# Patient Record
Sex: Female | Born: 1980 | Race: Black or African American | Hispanic: No | Marital: Married | Smoking: Never smoker
Health system: Southern US, Community
[De-identification: ages and names within clinical notes are randomized; demographics above are authoritative.]

## PROBLEM LIST (undated history)

## (undated) DIAGNOSIS — T7840XA Allergy, unspecified, initial encounter: Secondary | ICD-10-CM

## (undated) DIAGNOSIS — F32A Depression, unspecified: Secondary | ICD-10-CM

## (undated) DIAGNOSIS — B009 Herpesviral infection, unspecified: Secondary | ICD-10-CM

## (undated) HISTORY — DX: Allergy, unspecified, initial encounter: T78.40XA

## (undated) HISTORY — DX: Herpesviral infection, unspecified: B00.9

## (undated) HISTORY — DX: Depression, unspecified: F32.A

---

## 2017-10-23 DIAGNOSIS — Z8632 Personal history of gestational diabetes: Secondary | ICD-10-CM | POA: Insufficient documentation

## 2019-12-31 DIAGNOSIS — J4520 Mild intermittent asthma, uncomplicated: Secondary | ICD-10-CM | POA: Insufficient documentation

## 2020-01-07 DIAGNOSIS — E119 Type 2 diabetes mellitus without complications: Secondary | ICD-10-CM | POA: Insufficient documentation

## 2020-02-10 HISTORY — PX: BARIATRIC SURGERY: SHX1103

## 2020-05-29 DIAGNOSIS — Z9884 Bariatric surgery status: Secondary | ICD-10-CM | POA: Insufficient documentation

## 2020-09-23 ENCOUNTER — Emergency Department (HOSPITAL_COMMUNITY)
Admission: EM | Admit: 2020-09-23 | Discharge: 2020-09-23 | Disposition: A | Discharge status: Home / Self Care | Payer: Medicaid Other | Attending: Emergency Medicine | Admitting: Emergency Medicine

## 2020-09-23 ENCOUNTER — Emergency Department (HOSPITAL_COMMUNITY): Payer: Medicaid Other

## 2020-09-23 ENCOUNTER — Encounter (HOSPITAL_COMMUNITY): Payer: Self-pay

## 2020-09-23 ENCOUNTER — Other Ambulatory Visit: Payer: Self-pay

## 2020-09-23 DIAGNOSIS — K8020 Calculus of gallbladder without cholecystitis without obstruction: Secondary | ICD-10-CM | POA: Insufficient documentation

## 2020-09-23 DIAGNOSIS — N201 Calculus of ureter: Secondary | ICD-10-CM | POA: Insufficient documentation

## 2020-09-23 DIAGNOSIS — E876 Hypokalemia: Secondary | ICD-10-CM | POA: Insufficient documentation

## 2020-09-23 DIAGNOSIS — K439 Ventral hernia without obstruction or gangrene: Secondary | ICD-10-CM | POA: Diagnosis not present

## 2020-09-23 DIAGNOSIS — R109 Unspecified abdominal pain: Secondary | ICD-10-CM | POA: Diagnosis present

## 2020-09-23 LAB — COMPREHENSIVE METABOLIC PANEL
ALT: 31 U/L (ref 0–44)
AST: 23 U/L (ref 15–41)
Albumin: 3.4 g/dL — ABNORMAL LOW (ref 3.5–5.0)
Alkaline Phosphatase: 85 U/L (ref 38–126)
Anion gap: 7 (ref 5–15)
BUN: 10 mg/dL (ref 6–20)
CO2: 25 mmol/L (ref 22–32)
Calcium: 8.9 mg/dL (ref 8.9–10.3)
Chloride: 106 mmol/L (ref 98–111)
Creatinine, Ser: 0.67 mg/dL (ref 0.44–1.00)
GFR, Estimated: >60 mL/min (ref >60)
Glucose, Bld: 123 mg/dL — ABNORMAL HIGH (ref 70–99)
Potassium: 3 mmol/L — ABNORMAL LOW (ref 3.5–5.1)
Sodium: 138 mmol/L (ref 135–145)
Total Bilirubin: 0.3 mg/dL (ref 0.3–1.2)
Total Protein: 6.6 g/dL (ref 6.5–8.1)

## 2020-09-23 LAB — I-STAT BETA HCG BLOOD, ED (MC, WL, AP ONLY): I-stat hCG, quantitative: <5 m[IU]/mL (ref <5)

## 2020-09-23 LAB — URINALYSIS, ROUTINE W REFLEX MICROSCOPIC

## 2020-09-23 LAB — CBC WITH DIFFERENTIAL/PLATELET
Abs Immature Granulocytes: 0.01 10*3/uL (ref 0.00–0.07)
Basophils Absolute: 0 10*3/uL (ref 0.0–0.1)
Basophils Relative: 0 %
Eosinophils Absolute: 0.1 10*3/uL (ref 0.0–0.5)
Eosinophils Relative: 1 %
HCT: 34.5 % — ABNORMAL LOW (ref 36.0–46.0)
Hemoglobin: 11.5 g/dL — ABNORMAL LOW (ref 12.0–15.0)
Immature Granulocytes: 0 %
Lymphocytes Relative: 29 %
Lymphs Abs: 1.4 10*3/uL (ref 0.7–4.0)
MCH: 29.9 pg (ref 26.0–34.0)
MCHC: 33.3 g/dL (ref 30.0–36.0)
MCV: 89.6 fL (ref 80.0–100.0)
Monocytes Absolute: 0.3 10*3/uL (ref 0.1–1.0)
Monocytes Relative: 6 %
Neutro Abs: 2.9 10*3/uL (ref 1.7–7.7)
Neutrophils Relative %: 64 %
Platelets: 269 10*3/uL (ref 150–400)
RBC: 3.85 MIL/uL — ABNORMAL LOW (ref 3.87–5.11)
RDW: 13.1 % (ref 11.5–15.5)
WBC: 4.6 10*3/uL (ref 4.0–10.5)
nRBC: 0 % (ref 0.0–0.2)

## 2020-09-23 LAB — LIPASE, BLOOD: Lipase: 22 U/L (ref 11–51)

## 2020-09-23 LAB — URINALYSIS, MICROSCOPIC (REFLEX)
RBC / HPF: >50 RBC/hpf (ref 0–5)
WBC, UA: >50 WBC/hpf (ref 0–5)

## 2020-09-23 MED ORDER — ONDANSETRON 4 MG PO TBDP: 4.0000 mg | ORAL_TABLET | Freq: Three times a day (TID) | ORAL | 0 refills | Status: DC | PRN

## 2020-09-23 MED ORDER — TAMSULOSIN HCL 0.4 MG PO CAPS
0.4000 mg | ORAL_CAPSULE | Freq: Once | ORAL | Status: AC
  Administered 2020-09-23: 0.4 mg via ORAL
  Filled 2020-09-23: qty 1

## 2020-09-23 MED ORDER — TAMSULOSIN HCL 0.4 MG PO CAPS: 0.4000 mg | ORAL_CAPSULE | Freq: Every day | ORAL | 0 refills | Status: AC

## 2020-09-23 MED ORDER — OXYCODONE HCL 5 MG PO TABS: 5.0000 mg | ORAL_TABLET | Freq: Four times a day (QID) | ORAL | 0 refills | Status: AC | PRN

## 2020-09-23 MED ORDER — POTASSIUM CHLORIDE ER 20 MEQ PO TBCR: 20.0000 meq | EXTENDED_RELEASE_TABLET | Freq: Every day | ORAL | 0 refills | Status: DC

## 2020-09-23 MED ORDER — OXYCODONE-ACETAMINOPHEN 5-325 MG PO TABS
1.0000 | ORAL_TABLET | Freq: Once | ORAL | Status: AC
Start: 2020-09-23 — End: 2020-09-23
  Administered 2020-09-23: 1 via ORAL
  Filled 2020-09-23: qty 1

## 2020-09-23 MED ORDER — ONDANSETRON HCL 4 MG/2ML IJ SOLN
4.0000 mg | Freq: Once | INTRAMUSCULAR | Status: AC
  Administered 2020-09-23: 4 mg via INTRAVENOUS
  Filled 2020-09-23: qty 2

## 2020-09-23 MED ORDER — KETOROLAC TROMETHAMINE 30 MG/ML IJ SOLN
30.0000 mg | Freq: Once | INTRAMUSCULAR | Status: DC
  Filled 2020-09-23: qty 1

## 2020-09-23 MED ORDER — SODIUM CHLORIDE 0.9 % IV SOLN: Freq: Once | INTRAVENOUS | Status: AC

## 2020-09-23 MED ORDER — MORPHINE SULFATE (PF) 4 MG/ML IV SOLN
6.0000 mg | Freq: Once | INTRAVENOUS | Status: AC
  Administered 2020-09-23: 6 mg via INTRAVENOUS
  Filled 2020-09-23: qty 2

## 2020-09-23 NOTE — ED Provider Notes (Signed)
[facility] EMERGENCY DEPARTMENT Provider Note   CSN: [id number] Arrival date & time: 09/23/20  0749     History Chief Complaint  Patient presents with  . Flank Pain    Angie Wilcox is a 40 y.o. female with history of recent weight loss surgery in September 2021 at [facility] in [facility]) presents to the ED for sudden onset right-sided mid abdominal pain that radiates to her flank that woke her up this morning at around 5 AM.  Pain is constant, moderate to severe.  No alleviating or aggravating factors.  Associated with nausea.  Patient looks uncomfortable.  No interventions.  Denies fevers, chills, chest pain, shortness of breath.  No vomiting.  Reports ever since her weight loss surgery she has 2-3 bowel movements every morning which is normal for her.  She had 2-3 bowel movements this morning like she typically does without any blood, melena.  No dysuria, hematuria, urinary frequency.  No known kidney stones.  She still has her gallbladder.  No known gallstones.  She is now ending her menses and is having vaginal bleeding.  HPI     History reviewed. No pertinent past medical history.  There are no problems to display for this patient.   History reviewed. No pertinent surgical history.   OB History   No obstetric history on file.     History reviewed. No pertinent family history.  Social History   Tobacco Use  . Smoking status: Never Smoker  . Smokeless tobacco: Never Used    Home Medications Prior to Admission medications   Medication Sig Start Date End Date Taking? Authorizing Provider  ondansetron (ZOFRAN ODT) 4 MG disintegrating tablet Take 1 tablet (4 mg total) by mouth every 8 (eight) hours as needed for nausea or vomiting. 09/23/20  Yes Liberty Handy, PA-C  oxyCODONE (OXY IR/ROXICODONE) 5 MG immediate release tablet Take 1 tablet (5 mg total) by mouth every 6 (six) hours as needed for up to 3 days for severe pain  or breakthrough pain. 09/23/20 09/26/20 Yes Sharen Heck J, PA-C  potassium chloride 20 MEQ TBCR Take 20 mEq by mouth daily for 10 days. 09/23/20 10/03/20 Yes Liberty Handy, PA-C  tamsulosin (FLOMAX) 0.4 MG CAPS capsule Take 1 capsule (0.4 mg total) by mouth daily for 15 days. 09/23/20 10/08/20 Yes Liberty Handy, PA-C    Allergies    Patient has no allergy information on record.  Review of Systems   Review of Systems  Gastrointestinal: Positive for abdominal pain and nausea.  Genitourinary: Positive for flank pain.  All other systems reviewed and are negative.   Physical Exam Updated Vital Signs BP 121/89   Pulse 74   Temp 98.6 F (37 C) (Oral)   Resp 18   LMP 09/15/2020   SpO2 95%   Physical Exam Vitals and nursing note reviewed.  Constitutional:      Appearance: She is well-developed.     Comments: Non toxic in NAD  HENT:     Head: Normocephalic and atraumatic.     Nose: Nose normal.  Eyes:     Conjunctiva/sclera: Conjunctivae normal.  Cardiovascular:     Rate and Rhythm: Normal rate and regular rhythm.     Pulses:          Radial pulses are 1+ on the right side and 1+ on the left side.       Dorsalis pedis pulses are 1+ on the right side and 1+ on the  left side.  Pulmonary:     Effort: Pulmonary effort is normal.     Breath sounds: Normal breath sounds.  Abdominal:     General: Bowel sounds are normal.     Palpations: Abdomen is soft.     Tenderness: There is abdominal tenderness (right mid, RUQ, right lateral and CVA tenderness). There is right CVA tenderness.     Comments: No G/R/R. No suprapubic tenderness. Negative Murphy's and McBurney's. Active BS to lower quadrants.   Musculoskeletal:        General: Normal range of motion.     Cervical back: Normal range of motion.  Skin:    General: Skin is warm and dry.     Capillary Refill: Capillary refill takes less than 2 seconds.     Comments: Skin is normal over abdomen/flank  Neurological:     Mental  Status: She is alert.     Comments: Sensation and strength intact upper/lower extremities  Psychiatric:        Behavior: Behavior normal.     ED Results / Procedures / Treatments   Labs (all labs ordered are listed, but only abnormal results are displayed) Labs Reviewed  CBC WITH DIFFERENTIAL/PLATELET - Abnormal; Notable for the following components:      Result Value   RBC 3.85 (*)    Hemoglobin 11.5 (*)    HCT 34.5 (*)    All other components within normal limits  COMPREHENSIVE METABOLIC PANEL - Abnormal; Notable for the following components:   Potassium 3.0 (*)    Glucose, Bld 123 (*)    Albumin 3.4 (*)    All other components within normal limits  URINALYSIS, ROUTINE W REFLEX MICROSCOPIC - Abnormal; Notable for the following components:   Color, Urine RED (*)    APPearance TURBID (*)    Glucose, UA   (*)    Value: TEST NOT REPORTED DUE TO COLOR INTERFERENCE OF URINE PIGMENT   Hgb urine dipstick   (*)    Value: TEST NOT REPORTED DUE TO COLOR INTERFERENCE OF URINE PIGMENT   Bilirubin Urine   (*)    Value: TEST NOT REPORTED DUE TO COLOR INTERFERENCE OF URINE PIGMENT   Ketones, ur   (*)    Value: TEST NOT REPORTED DUE TO COLOR INTERFERENCE OF URINE PIGMENT   Protein, ur   (*)    Value: TEST NOT REPORTED DUE TO COLOR INTERFERENCE OF URINE PIGMENT   Nitrite   (*)    Value: TEST NOT REPORTED DUE TO COLOR INTERFERENCE OF URINE PIGMENT   Leukocytes,Ua   (*)    Value: TEST NOT REPORTED DUE TO COLOR INTERFERENCE OF URINE PIGMENT   All other components within normal limits  URINALYSIS, MICROSCOPIC (REFLEX) - Abnormal; Notable for the following components:   Bacteria, UA MANY (*)    Non Squamous Epithelial PRESENT (*)    All other components within normal limits  URINE CULTURE  LIPASE, BLOOD  I-STAT BETA HCG BLOOD, ED ([facility], [facility], [facility] ONLY)    EKG None  Radiology CT Renal Stone Study  Result Date: 09/23/2020 CLINICAL DATA:  Right mid abdominal pain. Sudden onset right flank  pain EXAM: CT ABDOMEN AND PELVIS WITHOUT CONTRAST TECHNIQUE: Multidetector CT imaging of the abdomen and pelvis was performed following the standard protocol without IV contrast. COMPARISON:  None. FINDINGS: Lower chest:  No contributory findings. Hepatobiliary: No focal liver abnormality.Cholelithiasis. There is likely also sludge. No evidence of acute cholecystitis. Pancreas: Unremarkable. Spleen: Unremarkable. Adrenals/Urinary Tract: Negative adrenals.  Mild right hydroureteronephrosis and low-density renal expansion due to a 3 mm stone at the UVJ. No additional urolithiasis. Unremarkable bladder. Stomach/Bowel: Prior weight loss surgery. No bowel obstruction or visible inflammation. Vascular/Lymphatic: No acute vascular abnormality. No mass or adenopathy. Reproductive:No pathologic findings. Other: No ascites or pneumoperitoneum. Fatty midline hernia which is small. Musculoskeletal: No acute abnormalities. IMPRESSION: Obstructing 3 mm stone at the right UVJ. Cholelithiasis. Electronically Signed   By: Marnee Spring M.D.   On: 09/23/2020 10:27    Procedures Procedures   Medications Ordered in ED Medications  ondansetron (ZOFRAN) injection 4 mg (4 mg Intravenous Given 09/23/20 0813)  morphine 4 MG/ML injection 6 mg (6 mg Intravenous Given 09/23/20 0813)  0.9 %  sodium chloride infusion ( Intravenous Stopped 09/23/20 1022)  oxyCODONE-acetaminophen (PERCOCET/ROXICET) 5-325 MG per tablet 1 tablet (1 tablet Oral Given 09/23/20 1040)  tamsulosin (FLOMAX) capsule 0.4 mg (0.4 mg Oral Given 09/23/20 1039)    ED Course  I have reviewed the triage vital signs and the nursing notes.  Pertinent labs & imaging results that were available during my care of the patient were reviewed by me and considered in my medical decision making (see chart for details).    MDM Rules/Calculators/A&P                          EMR triage and nursing notes reviewed  40 yo F here for sudden onset right abdominal and flank  pain, nausea. History of SADI procedure 2021.   Labs, imaging ordered and personally reviewed and interpreted by me  Labs reveal - Normal WBC. Hgb 11.5. K3.0. UA grossly bloody.   Imaging reveals - obstructing 3 mm stone at UVJ with mild hydronephrosis.  Other incidental findings including gallstones with sludge, small ventral hernia.    Patient re-evaluated after medicines and feels significantly better  Will discharge with flomax, zofran, oxycodone, K.  Encouraged oral hydration.  Patient told about incidental CT findings.  Follow up with urology as needed. Return precautions given.   Final Clinical Impression(s) / ED Diagnoses Final diagnoses:  Ureterolithiasis  Gallstones  Ventral hernia without obstruction or gangrene  Hypokalemia    Rx / DC Orders ED Discharge Orders         Ordered    tamsulosin (FLOMAX) 0.4 MG CAPS capsule  Daily        09/23/20 1057    ondansetron (ZOFRAN ODT) 4 MG disintegrating tablet  Every 8 hours PRN        09/23/20 1057    oxyCODONE (OXY IR/ROXICODONE) 5 MG immediate release tablet  Every 6 hours PRN        09/23/20 1057    potassium chloride 20 MEQ TBCR  Daily        09/23/20 1105           Liberty Handy, [place] 09/23/20 1107    Melene Plan, DO 09/23/20 1110

## 2020-09-23 NOTE — ED Triage Notes (Signed)
Pt reports she is here today due to sudden onset of right sided flank pain. Pt denies any abd pain, N&V,fevers

## 2020-09-23 NOTE — Discharge Instructions (Addendum)
You have a 3 mm stone at the end of your right ureteral almost at the bladder  You should fully pass it into the bladder in the next couple of days.  Stay hydrated drink at least 64 oz of water a day to help produce more urine and push stone out.    Take ondansetron for nausea as needed. Take tamsulosin once daily for at least 1 week to help dilate the ureter and help expel the stone  Take [phone] mg acetaminophen every 6 hours for mild/moderate pain.  Take oxycodone 5 mg for severe or break through pain.   For break through and/or severe pain despite acetaminophen, take 5 mg oxycodone every 4 hours.  Oxycodone is a narcotic pain medication that has risk of overdose, death, dependence and abuse. Mild and expected side effects include nausea, stomach upset, drowsiness, constipation. Do not consume alcohol, drive or use heavy machinery while taking this medication. Do not leave unattended around children. Flush any remaining pills that you do not use and do not share.  The emergency department has a strict policy regarding prescription of narcotic medications. We prescribe a short course for acute, new pain or injuries. We are unable to refill this medication in the emergency department for chronic pain or repeatedly.  Refills need to be done by specialist or primary care provider or pain clinic.  Contact your primary care provider or specialist for chronic pain management and refill on narcotic medications.   Return to the ER for worsening pain despite medicines, fever, persistent vomiting, inability to urinate   You may choose to follow up with urology as needed   Other incidental findings on scan include gallstones and sludge as well as a very small ventral hernia

## 2020-09-24 LAB — URINE CULTURE

## 2021-02-09 ENCOUNTER — Telehealth: Payer: Self-pay | Admitting: Family Medicine

## 2021-02-09 NOTE — Telephone Encounter (Signed)
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 Her son was seen here on 02/08/21. They all have Medicaid health insurance. She wants her whole family including her husband to be pt's of ours.

## 2021-02-24 NOTE — Telephone Encounter (Signed)
Dr. Veto Kemps,  Harriett Sine asked me to check with you. You do have more than 10 medicaid pts on your panel. This is the mother of a new child you saw recently. She also has Medicaid. Will you accept her?

## 2021-03-05 NOTE — Telephone Encounter (Signed)
Approved scheduling pt since child is established with Dr. Veto Kemps.

## 2021-03-05 NOTE — Telephone Encounter (Signed)
I have called pt and left a vm to given [place] a call back to schedule a NP appointment.

## 2021-03-22 ENCOUNTER — Other Ambulatory Visit: Payer: Self-pay | Admitting: Obstetrics & Gynecology

## 2021-03-22 DIAGNOSIS — N6453 Retraction of nipple: Secondary | ICD-10-CM

## 2021-03-22 DIAGNOSIS — N6452 Nipple discharge: Secondary | ICD-10-CM

## 2021-03-22 DIAGNOSIS — N644 Mastodynia: Secondary | ICD-10-CM

## 2021-03-23 LAB — RESULTS CONSOLE HPV: CHL HPV: NEGATIVE

## 2021-03-23 LAB — HM PAP SMEAR

## 2021-04-28 ENCOUNTER — Other Ambulatory Visit: Payer: Self-pay

## 2021-04-28 ENCOUNTER — Other Ambulatory Visit: Payer: Self-pay | Admitting: Physical Medicine and Rehabilitation

## 2021-04-28 ENCOUNTER — Ambulatory Visit
Admission: RE | Admit: 2021-04-28 | Discharge: 2021-04-28 | Disposition: A | Discharge status: Home / Self Care | Payer: Self-pay | Source: Ambulatory Visit | Attending: Obstetrics & Gynecology | Admitting: Obstetrics & Gynecology

## 2021-04-28 ENCOUNTER — Ambulatory Visit
Admission: RE | Admit: 2021-04-28 | Discharge: 2021-04-28 | Disposition: A | Discharge status: Home / Self Care | Payer: PRIVATE HEALTH INSURANCE | Source: Ambulatory Visit | Attending: Obstetrics & Gynecology | Admitting: Obstetrics & Gynecology

## 2021-04-28 DIAGNOSIS — N6452 Nipple discharge: Secondary | ICD-10-CM

## 2021-04-28 DIAGNOSIS — N6453 Retraction of nipple: Secondary | ICD-10-CM

## 2021-04-28 DIAGNOSIS — N644 Mastodynia: Secondary | ICD-10-CM

## 2021-04-28 LAB — HM MAMMOGRAPHY

## 2021-05-13 ENCOUNTER — Other Ambulatory Visit: Payer: Self-pay | Admitting: Obstetrics & Gynecology

## 2021-05-13 DIAGNOSIS — N6459 Other signs and symptoms in breast: Secondary | ICD-10-CM

## 2021-06-04 ENCOUNTER — Other Ambulatory Visit: Payer: Self-pay

## 2021-06-05 ENCOUNTER — Other Ambulatory Visit: Payer: Medicaid Other

## 2021-06-07 ENCOUNTER — Ambulatory Visit: Payer: Medicaid Other | Admitting: Family Medicine

## 2021-06-07 ENCOUNTER — Encounter: Payer: Self-pay | Admitting: Family Medicine

## 2021-06-07 ENCOUNTER — Ambulatory Visit (INDEPENDENT_AMBULATORY_CARE_PROVIDER_SITE_OTHER): Payer: Medicaid Other | Admitting: Family Medicine

## 2021-06-07 ENCOUNTER — Other Ambulatory Visit: Payer: Self-pay

## 2021-06-07 VITALS — BP 110/66 | HR 100 | Temp 98.1°F | Ht 67.5 in | Wt 192.6 lb

## 2021-06-07 DIAGNOSIS — Z1159 Encounter for screening for other viral diseases: Secondary | ICD-10-CM

## 2021-06-07 DIAGNOSIS — Z114 Encounter for screening for human immunodeficiency virus [HIV]: Secondary | ICD-10-CM

## 2021-06-07 DIAGNOSIS — R928 Other abnormal and inconclusive findings on diagnostic imaging of breast: Secondary | ICD-10-CM | POA: Diagnosis not present

## 2021-06-07 DIAGNOSIS — Z9884 Bariatric surgery status: Secondary | ICD-10-CM

## 2021-06-07 DIAGNOSIS — E119 Type 2 diabetes mellitus without complications: Secondary | ICD-10-CM | POA: Diagnosis not present

## 2021-06-07 DIAGNOSIS — J4520 Mild intermittent asthma, uncomplicated: Secondary | ICD-10-CM | POA: Diagnosis not present

## 2021-06-07 DIAGNOSIS — Z87442 Personal history of urinary calculi: Secondary | ICD-10-CM | POA: Insufficient documentation

## 2021-06-07 DIAGNOSIS — B009 Herpesviral infection, unspecified: Secondary | ICD-10-CM | POA: Insufficient documentation

## 2021-06-07 NOTE — Progress Notes (Signed)
[facility]ARE [facility] [address] [place] [address] Dept: [phone] Dept Fax: [phone]  New Patient Office Visit  Subjective:    Patient ID: Angie Wilcox, female    DOB: 06-07-1980, 41 y.o..   MRN: [id number]  Chief Complaint  Patient presents with   Establish Care    NP- establish care.   No concerns.  Wants a referral to a new bariatric doctor.     History of Present Illness:  Patient is in today to establish care. Angie Wilcox was born in [place], [place]. 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 She moved to [place] with her husband and her youngest son [name]- 4) about 8 months ago. This is her 2nd marriage and they have been married about 5 years. Her older child [name]- 12) still lives in [place]. Angie Wilcox denies tobacco, alcohol, or drug use.  Angie Wilcox has a history of morbid obesity. At her heaviest, she weight 315 lbs (BMI= 48.6). She underwent a duodenal switch procedure (SADI-S) in Sept. of 2021. She would like to establish care with a surgeon locally for ongoing monitoring.  Angie Wilcox did have Type 2 diabetes prior to her surgery. Her last A1c was near 5 and she has not needed treatment for this.  Angie Wilcox has a history of mild, intermittent asthma. She is managed with an albuterol inhaler, but needs this only very occasionally.  Angie Wilcox has a history of an abnormal mammogram of the right breast. She is scheduled for an MRI of her right breast to evaluate.  Past Medical History: Patient Active Problem List   Diagnosis Date Noted   S/P biliopancreatic diversion with duodenal switch 05/29/2020   Type 2 diabetes mellitus without complication, without long-term current use of insulin (HCC) 01/07/2020   Morbid obesity (HCC) 01/07/2020   Mild intermittent asthma 12/31/2019   History of gestational diabetes 10/23/2017   Past Surgical History:   Procedure Laterality Date   BARIATRIC SURGERY  02/10/2020   Sadi -S   Family History  Problem Relation Age of Onset   Hypertension Mother    Cancer Paternal Aunt        thyroid   Cancer Maternal Grandmother    Diabetes Paternal Grandmother    Outpatient Medications Prior to Visit  Medication Sig Dispense Refill   albuterol (VENTOLIN HFA) 108 (90 Base) MCG/ACT inhaler Inhale into the lungs.     Cholecalciferol (VITAMIN D3) 10 MCG (400 UNIT) tablet Take by mouth.     Multiple Vitamin (MULTIVITAMIN) tablet Take 1 tablet by mouth daily. Bariatric vitamin     valACYclovir (VALTREX) 500 MG tablet Take 500 mg by mouth 2 (two) times daily as needed.     ondansetron (ZOFRAN ODT) 4 MG disintegrating tablet Take 1 tablet (4 mg total) by mouth every 8 (eight) hours as needed for nausea or vomiting. 20 tablet 0   potassium chloride 20 MEQ TBCR Take 20 mEq by mouth daily for 10 days. 10 tablet 0   No facility-administered medications prior to visit.   No Known Allergies    Objective:   Today's Vitals   06/07/21 0820  BP: 110/66  Pulse: 100  Temp: 98.1 F (36.7 C)  TempSrc: Temporal  SpO2: 98%  Weight: 192 lb 9.6 oz (87.4 kg)  Height: 5' 7.5" (1.715 m)   Body mass index is 29.72 kg/m.   General: Well developed, well nourished. No acute  distress. Psych: Alert and oriented. Normal mood and affect.  Health Maintenance Due  Topic Date Due   HEMOGLOBIN A1C  Never done   FOOT EXAM  Never done   OPHTHALMOLOGY EXAM  Never done   URINE MICROALBUMIN  Never done   Hepatitis C Screening  Never done   PAP SMEAR-Modifier  Never done   COVID-19 Vaccine (3 - Booster for Pfizer series) 02/27/2020   Imaging: Mammogram and right breast ultrasound (04/28/2021) IMPRESSION: 1. Indeterminate oval circumscribed hypoechoic mass in the RIGHT breast at the 7 o'clock axis, retroareolar, 1 cm from the nipple, measuring 6 mm. This may represent a complicated cyst or papilloma and may be unrelated to  the nipple changes/discharge. Ultrasound-guided biopsy is recommended after breast MRI (see below). 2. Nipple inversion/retraction following 1 or 2 episodes of bloody nipple discharge (but no discharge recently). This is a suspicious finding for which breast MRI with contrast is recommended for further characterization. 3. No evidence of malignancy within the LEFT breast.   RECOMMENDATION: 1. Breast MRI with contrast for further characterization of patient's suspicious nipple changes detailed above.   Recommendation for breast MRI based on data provided by the [employer] of Radiology Appropriateness Criteria, produced by an expert panel on breast imaging, showing breast MRI with contrast to have a high sensitivity and NPV for identifying the cause of pathologic nipple discharge or pathologic nipple retraction.   2. If the breast MRI is negative, ultrasound-guided biopsy would then be recommended to ensure benignity for the indeterminate mass seen by ultrasound in the RIGHT breast at the 7 o'clock axis, retroareolar, 1 cm from the nipple, measuring 6 mm.    Assessment & Plan:   1. Mild intermittent asthma without complication Stable on periodic albuterol  2. Type 2 diabetes mellitus without complication, without long-term current use of insulin (HCC) Appears this may have resolved with her metabolic surgery. I will check annual DM labs to assess current status and screen for complications.  - Comprehensive metabolic panel; Future - Lipid panel; Future - Microalbumin / creatinine urine ratio; Future - Hemoglobin A1c; Future  3. S/P biliopancreatic diversion with duodenal switch I will check screening labs to assess nutritional status. I will refer her to a local surgeon for ongoing monitoring.  - Comprehensive metabolic panel; Future - CBC; Future - VITAMIN D 25 Hydroxy (Vit-D Deficiency, Fractures); Future - Iron, TIBC and Ferritin Panel; Future - Vitamin B12; Future - Folate;  Future - Vitamin B1; Future - Ambulatory referral to General Surgery  4. Abnormal mammogram of right breast MR of breast pending.  5. Encounter for hepatitis C screening test for low risk patient  - HCV Ab w Reflex to Quant PCR; Future  6. Screening for HIV (human immunodeficiency virus)  - HIV Antibody (routine testing w rflx); Future  Loyola Mast, MD

## 2021-06-14 ENCOUNTER — Other Ambulatory Visit (INDEPENDENT_AMBULATORY_CARE_PROVIDER_SITE_OTHER): Payer: Medicaid Other

## 2021-06-14 ENCOUNTER — Other Ambulatory Visit: Payer: Self-pay

## 2021-06-14 DIAGNOSIS — Z114 Encounter for screening for human immunodeficiency virus [HIV]: Secondary | ICD-10-CM

## 2021-06-14 DIAGNOSIS — Z9884 Bariatric surgery status: Secondary | ICD-10-CM | POA: Diagnosis not present

## 2021-06-14 DIAGNOSIS — E559 Vitamin D deficiency, unspecified: Secondary | ICD-10-CM

## 2021-06-14 DIAGNOSIS — E611 Iron deficiency: Secondary | ICD-10-CM

## 2021-06-14 DIAGNOSIS — E119 Type 2 diabetes mellitus without complications: Secondary | ICD-10-CM

## 2021-06-14 DIAGNOSIS — Z1159 Encounter for screening for other viral diseases: Secondary | ICD-10-CM

## 2021-06-14 LAB — COMPREHENSIVE METABOLIC PANEL
ALT: 22 U/L (ref 0–35)
AST: 21 U/L (ref 0–37)
Albumin: 4.1 g/dL (ref 3.5–5.2)
Alkaline Phosphatase: 90 U/L (ref 39–117)
BUN: 17 mg/dL (ref 6–23)
CO2: 26 mEq/L (ref 19–32)
Calcium: 8.9 mg/dL (ref 8.4–10.5)
Chloride: 106 mEq/L (ref 96–112)
Creatinine, Ser: 0.7 mg/dL (ref 0.40–1.20)
GFR: 107.3 mL/min (ref >60.00)
Glucose, Bld: 86 mg/dL (ref 70–99)
Potassium: 4.1 mEq/L (ref 3.5–5.1)
Sodium: 140 mEq/L (ref 135–145)
Total Bilirubin: 0.3 mg/dL (ref 0.2–1.2)
Total Protein: 7.2 g/dL (ref 6.0–8.3)

## 2021-06-14 LAB — LIPID PANEL
Cholesterol: 134 mg/dL (ref 0–200)
HDL: 41 mg/dL (ref >39.00)
LDL Cholesterol: 82 mg/dL (ref 0–99)
NonHDL: 92.71
Total CHOL/HDL Ratio: 3
Triglycerides: 56 mg/dL (ref 0.0–149.0)
VLDL: 11.2 mg/dL (ref 0.0–40.0)

## 2021-06-14 LAB — CBC
HCT: 32.8 % — ABNORMAL LOW (ref 36.0–46.0)
Hemoglobin: 10.6 g/dL — ABNORMAL LOW (ref 12.0–15.0)
MCHC: 32.4 g/dL (ref 30.0–36.0)
MCV: 82.9 fl (ref 78.0–100.0)
Platelets: 260 10*3/uL (ref 150.0–400.0)
RBC: 3.95 Mil/uL (ref 3.87–5.11)
RDW: 14.4 % (ref 11.5–15.5)
WBC: 3.4 10*3/uL — ABNORMAL LOW (ref 4.0–10.5)

## 2021-06-14 LAB — MICROALBUMIN / CREATININE URINE RATIO
Creatinine,U: 271.7 mg/dL
Microalb Creat Ratio: 0.6 mg/g (ref 0.0–30.0)
Microalb, Ur: 1.7 mg/dL (ref 0.0–1.9)

## 2021-06-14 LAB — FOLATE: Folate: 5.2 ng/mL — ABNORMAL LOW (ref >5.9)

## 2021-06-14 LAB — HEMOGLOBIN A1C: Hgb A1c MFr Bld: 5.4 % (ref 4.6–6.5)

## 2021-06-14 LAB — VITAMIN B12: Vitamin B-12: 266 pg/mL (ref 211–911)

## 2021-06-14 LAB — VITAMIN D 25 HYDROXY (VIT D DEFICIENCY, FRACTURES): VITD: 13.34 ng/mL — ABNORMAL LOW (ref 30.00–100.00)

## 2021-06-14 NOTE — Progress Notes (Signed)
Per the orders of Dr.rudd pt is here for labs pt tolerated draw well.  °

## 2021-06-15 LAB — HCV AB W REFLEX TO QUANT PCR: HCV Ab: <0.1 s/co ratio (ref 0.0–0.9)

## 2021-06-15 LAB — HCV INTERPRETATION

## 2021-06-15 MED ORDER — VITAMIN D (ERGOCALCIFEROL) 1.25 MG (50000 UNIT) PO CAPS: 50000.0000 [IU] | ORAL_CAPSULE | ORAL | 3 refills | Status: DC

## 2021-06-15 MED ORDER — IRON 325 (65 FE) MG PO TABS: 1.0000 | ORAL_TABLET | Freq: Two times a day (BID) | ORAL | 11 refills | Status: DC

## 2021-06-15 NOTE — Addendum Note (Signed)
Addended by: Loyola Mast on: 06/15/2021 08:15 AM   Modules accepted: Orders

## 2021-06-17 LAB — IRON,TIBC AND FERRITIN PANEL
%SAT: 6 % (calc) — ABNORMAL LOW (ref 16–45)
Ferritin: 3 ng/mL — ABNORMAL LOW (ref 16–232)
Iron: 26 ug/dL — ABNORMAL LOW (ref 40–190)
TIBC: 438 mcg/dL (calc) (ref 250–450)

## 2021-06-17 LAB — HIV ANTIBODY (ROUTINE TESTING W REFLEX): HIV 1&2 Ab, 4th Generation: NONREACTIVE

## 2021-06-17 LAB — VITAMIN B1: Vitamin B1 (Thiamine): 11 nmol/L (ref 8–30)

## 2021-07-02 ENCOUNTER — Other Ambulatory Visit: Payer: Self-pay

## 2021-07-02 ENCOUNTER — Ambulatory Visit
Admission: RE | Admit: 2021-07-02 | Discharge: 2021-07-02 | Disposition: A | Discharge status: Home / Self Care | Payer: Medicaid Other | Source: Ambulatory Visit | Attending: Obstetrics & Gynecology | Admitting: Obstetrics & Gynecology

## 2021-07-02 DIAGNOSIS — N6459 Other signs and symptoms in breast: Secondary | ICD-10-CM

## 2021-07-02 MED ORDER — GADOBUTROL 1 MMOL/ML IV SOLN
9.0000 mL | Freq: Once | INTRAVENOUS | Status: AC | PRN
  Administered 2021-07-02: 9 mL via INTRAVENOUS

## 2021-07-07 ENCOUNTER — Other Ambulatory Visit: Payer: Self-pay | Admitting: Obstetrics & Gynecology

## 2021-07-07 DIAGNOSIS — N6310 Unspecified lump in the right breast, unspecified quadrant: Secondary | ICD-10-CM

## 2021-07-10 ENCOUNTER — Other Ambulatory Visit: Payer: Self-pay | Admitting: Obstetrics & Gynecology

## 2021-07-10 DIAGNOSIS — N6459 Other signs and symptoms in breast: Secondary | ICD-10-CM

## 2021-07-20 ENCOUNTER — Ambulatory Visit
Admission: RE | Admit: 2021-07-20 | Discharge: 2021-07-20 | Disposition: A | Discharge status: Home / Self Care | Payer: Medicaid Other | Source: Ambulatory Visit | Attending: Obstetrics & Gynecology | Admitting: Obstetrics & Gynecology

## 2021-07-20 ENCOUNTER — Other Ambulatory Visit: Payer: Self-pay

## 2021-07-20 DIAGNOSIS — N6310 Unspecified lump in the right breast, unspecified quadrant: Secondary | ICD-10-CM

## 2021-10-07 ENCOUNTER — Inpatient Hospital Stay (HOSPITAL_COMMUNITY)
Admission: AD | Admit: 2021-10-07 | Discharge: 2021-10-07 | Discharge status: Against Medical Advice | Payer: Medicaid Other | Attending: Obstetrics & Gynecology | Admitting: Obstetrics & Gynecology

## 2021-10-07 DIAGNOSIS — Z5321 Procedure and treatment not carried out due to patient leaving prior to being seen by health care provider: Secondary | ICD-10-CM | POA: Diagnosis not present

## 2021-10-07 DIAGNOSIS — Z3201 Encounter for pregnancy test, result positive: Secondary | ICD-10-CM | POA: Diagnosis present

## 2021-10-07 LAB — POCT PREGNANCY, URINE: Preg Test, Ur: POSITIVE — AB

## 2021-10-07 NOTE — MAU Note (Signed)
MAU registration notified that patient left AMA. Signed AMA papers. Placed in Chart. Dr. Crissie Reese, MD made aware.

## 2021-12-09 DIAGNOSIS — A6000 Herpesviral infection of urogenital system, unspecified: Secondary | ICD-10-CM | POA: Insufficient documentation

## 2021-12-10 ENCOUNTER — Ambulatory Visit: Payer: Medicaid Other | Admitting: Family Medicine

## 2022-01-21 LAB — OB RESULTS CONSOLE RUBELLA ANTIBODY, IGM: Rubella: IMMUNE

## 2022-01-21 LAB — HEPATITIS C ANTIBODY: HCV Ab: NEGATIVE

## 2022-01-21 LAB — OB RESULTS CONSOLE HEPATITIS B SURFACE ANTIGEN: Hepatitis B Surface Ag: NEGATIVE

## 2022-01-26 LAB — OB RESULTS CONSOLE GC/CHLAMYDIA
Chlamydia: NEGATIVE
Neisseria Gonorrhea: NEGATIVE

## 2022-02-24 ENCOUNTER — Other Ambulatory Visit: Payer: Self-pay

## 2022-02-24 ENCOUNTER — Other Ambulatory Visit: Payer: Self-pay | Admitting: Obstetrics and Gynecology

## 2022-02-24 DIAGNOSIS — Z363 Encounter for antenatal screening for malformations: Secondary | ICD-10-CM

## 2022-03-21 ENCOUNTER — Encounter: Payer: Self-pay | Admitting: *Deleted

## 2022-03-28 ENCOUNTER — Ambulatory Visit: Payer: Medicaid Other | Admitting: *Deleted

## 2022-03-28 ENCOUNTER — Ambulatory Visit: Payer: Medicaid Other | Attending: Obstetrics and Gynecology

## 2022-03-28 ENCOUNTER — Other Ambulatory Visit: Payer: Self-pay | Admitting: *Deleted

## 2022-03-28 ENCOUNTER — Ambulatory Visit: Payer: Medicaid Other | Attending: Obstetrics | Admitting: Obstetrics

## 2022-03-28 ENCOUNTER — Encounter: Payer: Self-pay | Admitting: *Deleted

## 2022-03-28 VITALS — BP 116/66 | HR 78

## 2022-03-28 DIAGNOSIS — O09292 Supervision of pregnancy with other poor reproductive or obstetric history, second trimester: Secondary | ICD-10-CM

## 2022-03-28 DIAGNOSIS — O99842 Bariatric surgery status complicating pregnancy, second trimester: Secondary | ICD-10-CM | POA: Diagnosis not present

## 2022-03-28 DIAGNOSIS — Z363 Encounter for antenatal screening for malformations: Secondary | ICD-10-CM | POA: Insufficient documentation

## 2022-03-28 DIAGNOSIS — Z3A20 20 weeks gestation of pregnancy: Secondary | ICD-10-CM

## 2022-03-28 DIAGNOSIS — O09522 Supervision of elderly multigravida, second trimester: Secondary | ICD-10-CM | POA: Diagnosis present

## 2022-03-28 DIAGNOSIS — Z9884 Bariatric surgery status: Secondary | ICD-10-CM

## 2022-03-28 DIAGNOSIS — D649 Anemia, unspecified: Secondary | ICD-10-CM

## 2022-03-28 DIAGNOSIS — O99212 Obesity complicating pregnancy, second trimester: Secondary | ICD-10-CM | POA: Diagnosis not present

## 2022-03-28 DIAGNOSIS — O2620 Pregnancy care for patient with recurrent pregnancy loss, unspecified trimester: Secondary | ICD-10-CM

## 2022-03-28 DIAGNOSIS — O99012 Anemia complicating pregnancy, second trimester: Secondary | ICD-10-CM

## 2022-03-28 NOTE — Progress Notes (Signed)
MFM Note  Angie Wilcox was seen for a detailed fetal anatomy scan due to advanced maternal age (41 years old) and history of gastric bypass surgery.  The patient had gastric bypass surgery about 2 years ago.  She has lost 130 pounds since the surgery.    Her prior pregnancy was complicated by gestational diabetes requiring metformin and insulin for treatment.    She denies any problems in her current pregnancy.    She had a cell free DNA test earlier in her pregnancy which indicated a low risk for trisomy 60, 12, and 13. A female fetus is predicted.   She was informed that the fetal growth and amniotic fluid level were appropriate for her gestational age.   There were no obvious fetal anomalies noted on today's ultrasound exam.  The patient was informed that anomalies may be missed due to technical limitations. If the fetus is in a suboptimal position or maternal habitus is increased, visualization of the fetus in the maternal uterus may be impaired.  The following were discussed during today's consultation:  Advanced maternal age in pregnancy  The increased risk of fetal aneuploidy due to advanced maternal age was discussed.   Due to advanced maternal age, the patient was offered and declined an amniocentesis today for definitive diagnosis of fetal aneuploidy.  She is comfortable with the negative results from her cell free DNA test.   As women who are over the age of 40 may be at increased risk for a fetal demise, delivery should probably occur at around 39 weeks.  Prior bariatric surgery and pregnancy  The increased risk of fetal growth restriction in women who have had prior bariatric surgery was discussed.    We will continue to follow her with growth ultrasounds throughout her pregnancy.  The patient has a history of gestational diabetes in her prior pregnancy.    As women with bariatric surgery may not be able to tolerate the glucose load required for a gestational diabetes  screening test, an alternative strategy for screening may be to have her perform daily fingersticks for 1 week.  Due to advanced maternal age and maternal obesity, weekly fetal testing should be started at around 34 weeks.  A follow-up exam was scheduled in 5 weeks.    The patient stated that all of her questions were answered today.  A total of 30 minutes was spent counseling and coordinating the care for this patient.  Greater than 50% of the time was spent in direct face-to-face contact.

## 2022-05-02 ENCOUNTER — Ambulatory Visit: Payer: BC Managed Care – PPO | Admitting: *Deleted

## 2022-05-02 ENCOUNTER — Other Ambulatory Visit: Payer: Self-pay | Admitting: *Deleted

## 2022-05-02 ENCOUNTER — Ambulatory Visit: Payer: BC Managed Care – PPO | Attending: Obstetrics

## 2022-05-02 VITALS — BP 102/70 | HR 79

## 2022-05-02 DIAGNOSIS — O09522 Supervision of elderly multigravida, second trimester: Secondary | ICD-10-CM

## 2022-05-02 DIAGNOSIS — O2622 Pregnancy care for patient with recurrent pregnancy loss, second trimester: Secondary | ICD-10-CM

## 2022-05-02 DIAGNOSIS — Z3A25 25 weeks gestation of pregnancy: Secondary | ICD-10-CM

## 2022-05-02 DIAGNOSIS — O99842 Bariatric surgery status complicating pregnancy, second trimester: Secondary | ICD-10-CM

## 2022-05-02 DIAGNOSIS — Z363 Encounter for antenatal screening for malformations: Secondary | ICD-10-CM

## 2022-05-02 DIAGNOSIS — Z9884 Bariatric surgery status: Secondary | ICD-10-CM | POA: Insufficient documentation

## 2022-05-02 DIAGNOSIS — D649 Anemia, unspecified: Secondary | ICD-10-CM

## 2022-05-02 DIAGNOSIS — O09299 Supervision of pregnancy with other poor reproductive or obstetric history, unspecified trimester: Secondary | ICD-10-CM

## 2022-05-02 DIAGNOSIS — O99012 Anemia complicating pregnancy, second trimester: Secondary | ICD-10-CM

## 2022-05-16 NOTE — L&D Delivery Note (Signed)
Delivery Note: called for standby delivery for Dr. Delora Fuel who is in [place] a viable female infant was delivered via SVD while in maternal h&k, presentation: LOA. APGAR: 10, 10; weight pending.   Placenta status: spontaneously delivered intact with gentle cord traction. Fundus firm with massage and Pitocin.   Anesthesia: none Lacerations: 1st degree perineal Suture used for repair: 3-0 Vicryl rapide Est. Blood Loss (mL): 101 Placenta to home with pt Complications none Cord ph n/a   Mom to postpartum. Baby to Couplet care / Skin to Skin.    Julianne Handler, CNM 07/30/2022 6:51 PM

## 2022-05-30 ENCOUNTER — Ambulatory Visit: Payer: BC Managed Care – PPO | Admitting: *Deleted

## 2022-05-30 ENCOUNTER — Encounter: Payer: Self-pay | Admitting: *Deleted

## 2022-05-30 ENCOUNTER — Ambulatory Visit: Payer: BC Managed Care – PPO

## 2022-05-30 ENCOUNTER — Other Ambulatory Visit: Payer: Self-pay | Admitting: *Deleted

## 2022-05-30 ENCOUNTER — Ambulatory Visit: Payer: BC Managed Care – PPO | Attending: Obstetrics

## 2022-05-30 DIAGNOSIS — O99213 Obesity complicating pregnancy, third trimester: Secondary | ICD-10-CM

## 2022-05-30 DIAGNOSIS — O09522 Supervision of elderly multigravida, second trimester: Secondary | ICD-10-CM | POA: Insufficient documentation

## 2022-05-30 DIAGNOSIS — O2623 Pregnancy care for patient with recurrent pregnancy loss, third trimester: Secondary | ICD-10-CM | POA: Diagnosis not present

## 2022-05-30 DIAGNOSIS — O09299 Supervision of pregnancy with other poor reproductive or obstetric history, unspecified trimester: Secondary | ICD-10-CM | POA: Insufficient documentation

## 2022-05-30 DIAGNOSIS — O09293 Supervision of pregnancy with other poor reproductive or obstetric history, third trimester: Secondary | ICD-10-CM | POA: Diagnosis not present

## 2022-05-30 DIAGNOSIS — O99012 Anemia complicating pregnancy, second trimester: Secondary | ICD-10-CM | POA: Insufficient documentation

## 2022-05-30 DIAGNOSIS — Z3A29 29 weeks gestation of pregnancy: Secondary | ICD-10-CM

## 2022-05-30 DIAGNOSIS — O99843 Bariatric surgery status complicating pregnancy, third trimester: Secondary | ICD-10-CM

## 2022-05-30 DIAGNOSIS — Z8632 Personal history of gestational diabetes: Secondary | ICD-10-CM | POA: Diagnosis present

## 2022-05-30 DIAGNOSIS — O09523 Supervision of elderly multigravida, third trimester: Secondary | ICD-10-CM

## 2022-05-30 DIAGNOSIS — Z9884 Bariatric surgery status: Secondary | ICD-10-CM | POA: Diagnosis present

## 2022-06-02 LAB — OB RESULTS CONSOLE RPR: RPR: NONREACTIVE

## 2022-06-17 ENCOUNTER — Inpatient Hospital Stay: Payer: BC Managed Care – PPO | Attending: Hematology and Oncology | Admitting: Hematology and Oncology

## 2022-06-17 ENCOUNTER — Telehealth: Payer: Self-pay

## 2022-06-17 ENCOUNTER — Inpatient Hospital Stay: Payer: BC Managed Care – PPO

## 2022-06-17 VITALS — BP 118/75 | HR 85 | Temp 97.3°F | Resp 14 | Wt 194.9 lb

## 2022-06-17 DIAGNOSIS — D509 Iron deficiency anemia, unspecified: Secondary | ICD-10-CM | POA: Diagnosis present

## 2022-06-17 DIAGNOSIS — D500 Iron deficiency anemia secondary to blood loss (chronic): Secondary | ICD-10-CM

## 2022-06-17 DIAGNOSIS — Z87448 Personal history of other diseases of urinary system: Secondary | ICD-10-CM | POA: Insufficient documentation

## 2022-06-17 DIAGNOSIS — Z809 Family history of malignant neoplasm, unspecified: Secondary | ICD-10-CM | POA: Insufficient documentation

## 2022-06-17 DIAGNOSIS — Z9884 Bariatric surgery status: Secondary | ICD-10-CM | POA: Diagnosis not present

## 2022-06-17 DIAGNOSIS — O99019 Anemia complicating pregnancy, unspecified trimester: Secondary | ICD-10-CM | POA: Diagnosis not present

## 2022-06-17 DIAGNOSIS — Z808 Family history of malignant neoplasm of other organs or systems: Secondary | ICD-10-CM | POA: Insufficient documentation

## 2022-06-17 LAB — CMP (CANCER CENTER ONLY)
ALT: 38 U/L (ref 0–44)
AST: 48 U/L — ABNORMAL HIGH (ref 15–41)
Albumin: 3 g/dL — ABNORMAL LOW (ref 3.5–5.0)
Alkaline Phosphatase: 86 U/L (ref 38–126)
Anion gap: 6 (ref 5–15)
BUN: 9 mg/dL (ref 6–20)
CO2: 27 mmol/L (ref 22–32)
Calcium: 8.4 mg/dL — ABNORMAL LOW (ref 8.9–10.3)
Chloride: 107 mmol/L (ref 98–111)
Creatinine: 0.49 mg/dL (ref 0.44–1.00)
GFR, Estimated: >60 mL/min (ref >60)
Glucose, Bld: 83 mg/dL (ref 70–99)
Potassium: 3.6 mmol/L (ref 3.5–5.1)
Sodium: 140 mmol/L (ref 135–145)
Total Bilirubin: 0.3 mg/dL (ref 0.3–1.2)
Total Protein: 6.1 g/dL — ABNORMAL LOW (ref 6.5–8.1)

## 2022-06-17 LAB — CBC WITH DIFFERENTIAL (CANCER CENTER ONLY)
Abs Immature Granulocytes: 0.02 10*3/uL (ref 0.00–0.07)
Basophils Absolute: 0 10*3/uL (ref 0.0–0.1)
Basophils Relative: 0 %
Eosinophils Absolute: 0 10*3/uL (ref 0.0–0.5)
Eosinophils Relative: 0 %
HCT: 27.7 % — ABNORMAL LOW (ref 36.0–46.0)
Hemoglobin: 9.2 g/dL — ABNORMAL LOW (ref 12.0–15.0)
Immature Granulocytes: 0 %
Lymphocytes Relative: 25 %
Lymphs Abs: 1.3 10*3/uL (ref 0.7–4.0)
MCH: 26.3 pg (ref 26.0–34.0)
MCHC: 33.2 g/dL (ref 30.0–36.0)
MCV: 79.1 fL — ABNORMAL LOW (ref 80.0–100.0)
Monocytes Absolute: 0.4 10*3/uL (ref 0.1–1.0)
Monocytes Relative: 8 %
Neutro Abs: 3.4 10*3/uL (ref 1.7–7.7)
Neutrophils Relative %: 67 %
Platelet Count: 267 10*3/uL (ref 150–400)
RBC: 3.5 MIL/uL — ABNORMAL LOW (ref 3.87–5.11)
RDW: 13.4 % (ref 11.5–15.5)
WBC Count: 5.1 10*3/uL (ref 4.0–10.5)
nRBC: 0 % (ref 0.0–0.2)

## 2022-06-17 LAB — RETIC PANEL
Immature Retic Fract: 23.1 % — ABNORMAL HIGH (ref 2.3–15.9)
RBC.: 3.46 MIL/uL — ABNORMAL LOW (ref 3.87–5.11)
Retic Count, Absolute: 55.4 10*3/uL (ref 19.0–186.0)
Retic Ct Pct: 1.6 % (ref 0.4–3.1)
Reticulocyte Hemoglobin: 25.7 pg — ABNORMAL LOW (ref >27.9)

## 2022-06-17 LAB — IRON AND IRON BINDING CAPACITY (CC-WL,HP ONLY)
Iron: 22 ug/dL — ABNORMAL LOW (ref 28–170)
Saturation Ratios: 4 % — ABNORMAL LOW (ref 10.4–31.8)
TIBC: 606 ug/dL — ABNORMAL HIGH (ref 250–450)
UIBC: 584 ug/dL — ABNORMAL HIGH (ref 148–442)

## 2022-06-17 LAB — FOLATE: Folate: 6.6 ng/mL (ref >5.9)

## 2022-06-17 LAB — VITAMIN B12: Vitamin B-12: 967 pg/mL — ABNORMAL HIGH (ref 180–914)

## 2022-06-17 LAB — FERRITIN: Ferritin: 4 ng/mL — ABNORMAL LOW (ref 11–307)

## 2022-06-17 NOTE — Telephone Encounter (Signed)
Spoke with patient to advise of infusion apt at [facility] on 06/24/22 @ 10 AM. Encouraged patient to call infusion department to get directions.

## 2022-06-17 NOTE — Telephone Encounter (Signed)
Called medical day scheduler and left VM to schedule Feraheme infusions (2).

## 2022-06-17 NOTE — Progress Notes (Signed)
[place] [phone]   Fax:([phone] [place] NOTE  Patient Care Team: Haydee Salter, MD as PCP - General (Family Medicine) Waymon Amato, MD as PCP - OBGYN (Obstetrics and Gynecology) Waymon Amato, MD as Consulting Physician (Obstetrics and Gynecology)  Hematological/Oncological History # Anemia in Pregnancy  06/14/2021: Ferritin 3, Folate 5.2, Iron sat 6%, Hgb 10.6, MCV 82.9, Plt 260 06/17/2022: establish care with Dr. Lorenso Courier   CHIEF COMPLAINTS/PURPOSE OF CONSULTATION:  "Iron Deficiency Anemia in Pregnancy "  HISTORY OF PRESENTING ILLNESS:  Angie Wilcox 42 y.o. female with medical history significant for bariatric surgery who presents for evaluation of iron deficiency anemia in pregnancy.  On review of the previous records Angie Wilcox last had labs drawn on 06/14/2021 which showed ferritin of 3, folate 5.2, iron sat 6%, with a hemoglobin of 10.6.  More recent labs are not currently available from the outside records.  Due to concern for her history of iron deficiency and current anemia the patient was referred to hematology for further evaluation and management.  On exam today Angie Wilcox reports that she is having some nausea at this point time with her pregnancy.  She reports that she has been unable to tolerate p.o. iron therapy because of the side effects and therefore has been taking Floranex because it has been easier to tolerate.  She reports that she underwent bariatric surgery back in 2021.  She notes that this is her third child with the oldest being 87 and the most recent being at 42 years old.  She reports that she has lost a considerable and her weight going from 315 pounds down to 173 at 1 point.  She did unfortunately suffer from 2 miscarriages last year.  She is currently taking a prenatal vitamin and a bariatric vitamin.  On further discussion she reports that there are no history of blood disease in her family.  She reports that  her mother has hypertension and her aunt has thyroid cancer.  She reports that her maternal grandmother had some form of cancer.  She is a never smoker never drinker and currently works as an Insurance underwriter at a [employer].  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  She notes that she does have a history of PCOS and did have consistently heavy periods before she became pregnant.  She reports on average her cycles would last for about 4 days and were quite heavy.  She is currently planning for natural delivery.  She otherwise denies any fevers, chills, sweats, nausea, vomiting or diarrhea.  A full 10 point ROS was otherwise negative.  MEDICAL HISTORY:  Past Medical History:  Diagnosis Date   Allergy    Depression    Herpes     SURGICAL HISTORY: Past Surgical History:  Procedure Laterality Date   BARIATRIC SURGERY  02/10/2020   Sadi -S    SOCIAL HISTORY: Social History   Socioeconomic History   Marital status: Married    Spouse name: Not on file   Number of children: 2   Years of education: Not on file   Highest education level: Not on file  Occupational History   Occupation: Chaplain  Tobacco Use   Smoking status: Never   Smokeless tobacco: Never  Vaping Use   Vaping Use: Never used  Substance and Sexual Activity   Alcohol use: Not Currently   Drug use: Never   Sexual activity: Yes  Other Topics Concern   Not  on file  Social History Narrative   Not on file   Social Determinants of Health   Financial Resource Strain: Not on file  Food Insecurity: Not on file  Transportation Needs: Not on file  Physical Activity: Not on file  Stress: Not on file  Social Connections: Not on file  Intimate Partner Violence: Not on file    FAMILY HISTORY: Family History  Problem Relation Age of Onset   Hypertension Mother    Cancer Paternal Aunt        thyroid   Cancer Maternal Grandmother    Diabetes Paternal Grandmother     ALLERGIES:  has No Known  Allergies.  MEDICATIONS:  Current Outpatient Medications  Medication Sig Dispense Refill   Multiple Vitamin (MULTIVITAMIN) tablet Take 1 tablet by mouth daily. Bariatric vitamin     valACYclovir (VALTREX) 500 MG tablet Take 500 mg by mouth 2 (two) times daily as needed. (Patient not taking: Reported on 06/27/2022)     No current facility-administered medications for this visit.    REVIEW OF SYSTEMS:   Constitutional: ( - ) fevers, ( - )  chills , ( - ) night sweats Eyes: ( - ) blurriness of vision, ( - ) double vision, ( - ) watery eyes Ears, nose, mouth, throat, and face: ( - ) mucositis, ( - ) sore throat Respiratory: ( - ) cough, ( - ) dyspnea, ( - ) wheezes Cardiovascular: ( - ) palpitation, ( - ) chest discomfort, ( - ) lower extremity swelling Gastrointestinal:  ( - ) nausea, ( - ) heartburn, ( - ) change in bowel habits Skin: ( - ) abnormal skin rashes Lymphatics: ( - ) new lymphadenopathy, ( - ) easy bruising Neurological: ( - ) numbness, ( - ) tingling, ( - ) new weaknesses Behavioral/Psych: ( - ) mood change, ( - ) new changes  All other systems were reviewed with the patient and are negative.  PHYSICAL EXAMINATION:  Vitals:   06/17/22 0914  BP: 118/75  Pulse: 85  Resp: 14  Temp: (!) 97.3 F (36.3 C)  SpO2: 98%   Filed Weights   06/17/22 0914  Weight: 194 lb 14.4 oz (88.4 kg)    GENERAL: well appearing middle-aged female in NAD  SKIN: skin color, texture, turgor are normal, no rashes or significant lesions EYES: conjunctiva are pink and non-injected, sclera clear LUNGS: clear to auscultation and percussion with normal breathing effort HEART: regular rate & rhythm and no murmurs and no lower extremity edema Musculoskeletal: no cyanosis of digits and no clubbing  PSYCH: alert & oriented x 3, fluent speech NEURO: no focal motor/sensory deficits  LABORATORY DATA:  I have reviewed the data as listed    Latest Ref Rng & Units 06/17/2022   10:14 AM 06/14/2021     8:04 AM 09/23/2020    8:17 AM  CBC  WBC 4.0 - 10.5 K/uL 5.1  3.4  4.6   Hemoglobin 12.0 - 15.0 g/dL 9.2  10.6  11.5   Hematocrit 36.0 - 46.0 % 27.7  32.8  34.5   Platelets 150 - 400 K/uL 267  260.0  269        Latest Ref Rng & Units 06/17/2022   10:14 AM 06/14/2021    8:04 AM 09/23/2020    8:17 AM  CMP  Glucose 70 - 99 mg/dL 83  86  123   BUN 6 - 20 mg/dL 9  17  10   $ Creatinine 0.44 - 1.00 mg/dL  0.49  0.70  0.67   Sodium 135 - 145 mmol/L 140  140  138   Potassium 3.5 - 5.1 mmol/L 3.6  4.1  3.0   Chloride 98 - 111 mmol/L 107  106  106   CO2 22 - 32 mmol/L 27  26  25   $ Calcium 8.9 - 10.3 mg/dL 8.4  8.9  8.9   Total Protein 6.5 - 8.1 g/dL 6.1  7.2  6.6   Total Bilirubin 0.3 - 1.2 mg/dL 0.3  0.3  0.3   Alkaline Phos 38 - 126 U/L 86  90  85   AST 15 - 41 U/L 48  21  23   ALT 0 - 44 U/L 38  22  31      ASSESSMENT & PLAN Angie Wilcox 42 y.o. female with medical history significant for bariatric surgery who presents for evaluation of iron deficiency anemia in pregnancy.  After review of the labs, review of the records, and discussion with the patient the patients findings are most consistent with iron deficiency anemia secondary to bariatric surgery and pregnancy.  # Iron Deficiency Anemia in Setting of Pregnancy # Iron Deficiency Anemia in Setting of Bariatric Surgery -- Findings are consistent with iron deficiency anemia secondary to bariatric surgery and pregnancy.  Patient likely had low iron stores due to menstrual bleeding prior to pregnancy. --We will confirm iron deficiency anemia by ordering iron panel and ferritin as well as reticulocytes, CBC, and CMP -- Recommend avoiding the use of p.o. iron therapy after the first trimester as it can cause constipation and is poorly absorbed, particular in the setting of prior bariatric surgery --We will plan to proceed with IV iron therapy in order to help bolster the patient's blood counts --Plan for return to clinic in 4 to 6 weeks  time after last dose of IV iron   Orders Placed This Encounter  Procedures   CBC with Differential ([facility] Only)    Standing Status:   Future    Number of Occurrences:   1    Standing Expiration Date:   06/18/2023   CMP ([facility] only)    Standing Status:   Future    Number of Occurrences:   1    Standing Expiration Date:   06/18/2023   Ferritin    Standing Status:   Future    Number of Occurrences:   1    Standing Expiration Date:   06/18/2023   Iron and Iron Binding Capacity ([facility] only)    Standing Status:   Future    Number of Occurrences:   1    Standing Expiration Date:   06/18/2023   Retic Panel    Standing Status:   Future    Number of Occurrences:   1    Standing Expiration Date:   06/18/2023   Vitamin B12    Standing Status:   Future    Number of Occurrences:   1    Standing Expiration Date:   06/17/2023   Folate, Serum    Standing Status:   Future    Number of Occurrences:   1    Standing Expiration Date:   06/17/2023    All questions were answered. The patient knows to call the clinic with any problems, questions or concerns.  A total of more than 60 minutes were spent on this encounter with face-to-face time and non-face-to-face time, including preparing to see the patient, ordering tests and/or medications, counseling the  patient and coordination of care as outlined above.   Ledell Peoples, MD Department of Hematology/Oncology [place] at [facility] Phone: [phone] Pager: [phone] Email: [email or web address]

## 2022-06-24 ENCOUNTER — Encounter (HOSPITAL_COMMUNITY)
Admission: RE | Admit: 2022-06-24 | Discharge: 2022-06-24 | Disposition: A | Discharge status: Home / Self Care | Payer: BC Managed Care – PPO | Source: Ambulatory Visit | Attending: Hematology and Oncology | Admitting: Hematology and Oncology

## 2022-06-24 DIAGNOSIS — D500 Iron deficiency anemia secondary to blood loss (chronic): Secondary | ICD-10-CM | POA: Insufficient documentation

## 2022-06-24 MED ORDER — SODIUM CHLORIDE 0.9 % IV SOLN
510.0000 mg | INTRAVENOUS | Status: DC
  Administered 2022-06-24: 510 mg via INTRAVENOUS
  Filled 2022-06-24: qty 17

## 2022-06-27 ENCOUNTER — Encounter: Payer: Self-pay | Admitting: Hematology and Oncology

## 2022-06-27 ENCOUNTER — Ambulatory Visit: Payer: BC Managed Care – PPO | Attending: Obstetrics

## 2022-06-27 ENCOUNTER — Ambulatory Visit: Payer: BC Managed Care – PPO | Admitting: *Deleted

## 2022-06-27 VITALS — BP 114/68 | HR 89

## 2022-06-27 DIAGNOSIS — O2623 Pregnancy care for patient with recurrent pregnancy loss, third trimester: Secondary | ICD-10-CM | POA: Diagnosis not present

## 2022-06-27 DIAGNOSIS — O09523 Supervision of elderly multigravida, third trimester: Secondary | ICD-10-CM | POA: Insufficient documentation

## 2022-06-27 DIAGNOSIS — Z9884 Bariatric surgery status: Secondary | ICD-10-CM | POA: Insufficient documentation

## 2022-06-27 DIAGNOSIS — Z3A33 33 weeks gestation of pregnancy: Secondary | ICD-10-CM

## 2022-06-27 DIAGNOSIS — O99213 Obesity complicating pregnancy, third trimester: Secondary | ICD-10-CM | POA: Diagnosis present

## 2022-06-27 DIAGNOSIS — O09293 Supervision of pregnancy with other poor reproductive or obstetric history, third trimester: Secondary | ICD-10-CM | POA: Diagnosis not present

## 2022-06-27 DIAGNOSIS — O99843 Bariatric surgery status complicating pregnancy, third trimester: Secondary | ICD-10-CM | POA: Diagnosis not present

## 2022-06-27 DIAGNOSIS — E669 Obesity, unspecified: Secondary | ICD-10-CM

## 2022-06-27 DIAGNOSIS — Z8632 Personal history of gestational diabetes: Secondary | ICD-10-CM

## 2022-06-29 ENCOUNTER — Ambulatory Visit: Payer: BC Managed Care – PPO

## 2022-07-01 ENCOUNTER — Encounter (HOSPITAL_COMMUNITY)
Admission: RE | Admit: 2022-07-01 | Discharge: 2022-07-01 | Disposition: A | Discharge status: Home / Self Care | Payer: BC Managed Care – PPO | Source: Ambulatory Visit | Attending: Hematology and Oncology | Admitting: Hematology and Oncology

## 2022-07-01 DIAGNOSIS — D500 Iron deficiency anemia secondary to blood loss (chronic): Secondary | ICD-10-CM | POA: Diagnosis not present

## 2022-07-01 MED ORDER — SODIUM CHLORIDE 0.9 % IV SOLN
510.0000 mg | INTRAVENOUS | Status: DC
  Administered 2022-07-01: 510 mg via INTRAVENOUS
  Filled 2022-07-01: qty 510

## 2022-07-04 ENCOUNTER — Ambulatory Visit: Payer: BC Managed Care – PPO

## 2022-07-04 ENCOUNTER — Ambulatory Visit: Payer: BC Managed Care – PPO | Attending: Obstetrics

## 2022-07-04 ENCOUNTER — Other Ambulatory Visit: Payer: BC Managed Care – PPO

## 2022-07-04 ENCOUNTER — Ambulatory Visit: Payer: BC Managed Care – PPO | Admitting: *Deleted

## 2022-07-04 VITALS — BP 120/66 | HR 79

## 2022-07-04 DIAGNOSIS — O09523 Supervision of elderly multigravida, third trimester: Secondary | ICD-10-CM

## 2022-07-04 DIAGNOSIS — O99843 Bariatric surgery status complicating pregnancy, third trimester: Secondary | ICD-10-CM | POA: Diagnosis not present

## 2022-07-04 DIAGNOSIS — O2623 Pregnancy care for patient with recurrent pregnancy loss, third trimester: Secondary | ICD-10-CM

## 2022-07-04 DIAGNOSIS — O99213 Obesity complicating pregnancy, third trimester: Secondary | ICD-10-CM | POA: Insufficient documentation

## 2022-07-04 DIAGNOSIS — Z9884 Bariatric surgery status: Secondary | ICD-10-CM | POA: Insufficient documentation

## 2022-07-04 DIAGNOSIS — Z8632 Personal history of gestational diabetes: Secondary | ICD-10-CM

## 2022-07-04 DIAGNOSIS — Z3A34 34 weeks gestation of pregnancy: Secondary | ICD-10-CM

## 2022-07-04 DIAGNOSIS — O09293 Supervision of pregnancy with other poor reproductive or obstetric history, third trimester: Secondary | ICD-10-CM

## 2022-07-07 ENCOUNTER — Inpatient Hospital Stay (HOSPITAL_COMMUNITY)
Admission: AD | Admit: 2022-07-07 | Discharge: 2022-07-07 | Disposition: A | Discharge status: Home / Self Care | Payer: BC Managed Care – PPO | Attending: Obstetrics and Gynecology | Admitting: Obstetrics and Gynecology

## 2022-07-07 ENCOUNTER — Inpatient Hospital Stay (HOSPITAL_BASED_OUTPATIENT_CLINIC_OR_DEPARTMENT_OTHER)
Admit: 2022-07-07 | Discharge: 2022-07-07 | Disposition: A | Discharge status: Home / Self Care | Payer: BC Managed Care – PPO

## 2022-07-07 ENCOUNTER — Encounter (HOSPITAL_COMMUNITY): Payer: Self-pay | Admitting: Obstetrics and Gynecology

## 2022-07-07 DIAGNOSIS — Z3A34 34 weeks gestation of pregnancy: Secondary | ICD-10-CM | POA: Insufficient documentation

## 2022-07-07 DIAGNOSIS — O09523 Supervision of elderly multigravida, third trimester: Secondary | ICD-10-CM | POA: Diagnosis not present

## 2022-07-07 DIAGNOSIS — O1203 Gestational edema, third trimester: Secondary | ICD-10-CM | POA: Insufficient documentation

## 2022-07-07 DIAGNOSIS — O99283 Endocrine, nutritional and metabolic diseases complicating pregnancy, third trimester: Secondary | ICD-10-CM | POA: Insufficient documentation

## 2022-07-07 DIAGNOSIS — O26893 Other specified pregnancy related conditions, third trimester: Secondary | ICD-10-CM | POA: Diagnosis not present

## 2022-07-07 DIAGNOSIS — M7989 Other specified soft tissue disorders: Secondary | ICD-10-CM | POA: Diagnosis not present

## 2022-07-07 DIAGNOSIS — R600 Localized edema: Secondary | ICD-10-CM

## 2022-07-07 DIAGNOSIS — E876 Hypokalemia: Secondary | ICD-10-CM | POA: Diagnosis present

## 2022-07-07 LAB — COMPREHENSIVE METABOLIC PANEL
ALT: 40 U/L (ref 0–44)
AST: 44 U/L — ABNORMAL HIGH (ref 15–41)
Albumin: 2.5 g/dL — ABNORMAL LOW (ref 3.5–5.0)
Alkaline Phosphatase: 84 U/L (ref 38–126)
Anion gap: 10 (ref 5–15)
BUN: 5 mg/dL — ABNORMAL LOW (ref 6–20)
CO2: 24 mmol/L (ref 22–32)
Calcium: 8.4 mg/dL — ABNORMAL LOW (ref 8.9–10.3)
Chloride: 103 mmol/L (ref 98–111)
Creatinine, Ser: 0.58 mg/dL (ref 0.44–1.00)
GFR, Estimated: >60 mL/min (ref >60)
Glucose, Bld: 73 mg/dL (ref 70–99)
Potassium: 2.9 mmol/L — ABNORMAL LOW (ref 3.5–5.1)
Sodium: 137 mmol/L (ref 135–145)
Total Bilirubin: 0.7 mg/dL (ref 0.3–1.2)
Total Protein: 6 g/dL — ABNORMAL LOW (ref 6.5–8.1)

## 2022-07-07 LAB — URINALYSIS, ROUTINE W REFLEX MICROSCOPIC
Bilirubin Urine: NEGATIVE
Glucose, UA: NEGATIVE mg/dL
Hgb urine dipstick: NEGATIVE
Ketones, ur: 20 mg/dL — AB
Leukocytes,Ua: NEGATIVE
Nitrite: NEGATIVE
Protein, ur: NEGATIVE mg/dL
Specific Gravity, Urine: 1.008 (ref 1.005–1.030)
pH: 7 (ref 5.0–8.0)

## 2022-07-07 LAB — CBC
HCT: 29.5 % — ABNORMAL LOW (ref 36.0–46.0)
Hemoglobin: 9.7 g/dL — ABNORMAL LOW (ref 12.0–15.0)
MCH: 27.2 pg (ref 26.0–34.0)
MCHC: 32.9 g/dL (ref 30.0–36.0)
MCV: 82.9 fL (ref 80.0–100.0)
Platelets: 226 10*3/uL (ref 150–400)
RBC: 3.56 MIL/uL — ABNORMAL LOW (ref 3.87–5.11)
RDW: 18.6 % — ABNORMAL HIGH (ref 11.5–15.5)
WBC: 5.5 10*3/uL (ref 4.0–10.5)
nRBC: 0 % (ref 0.0–0.2)

## 2022-07-07 MED ORDER — POTASSIUM CHLORIDE CRYS ER 20 MEQ PO TBCR: 20.0000 meq | EXTENDED_RELEASE_TABLET | Freq: Every day | ORAL | 0 refills | Status: DC

## 2022-07-07 NOTE — MAU Provider Note (Addendum)
History     CSN: [id number]  Arrival date and time: 07/07/22 1639   Event Date/Time   First Provider Initiated Contact with Patient 07/07/22 1714      Chief Complaint  Patient presents with   Pelvic Pain   Fatigue   Leg Swelling   Pelvic Pain The patient's primary symptoms include pelvic pain. The patient's pertinent negatives include no vaginal discharge. Associated symptoms include constipation and headaches. Pertinent negatives include no abdominal pain, diarrhea, dysuria, fever, flank pain, hematuria, nausea or vomiting.   42 year old [id number] who presents with a CC of "headaches, vision changes, swelling, and shortness of breath." Pt states that for the past 2 weeks she has been experiencing headaches and vision changes that she describes as "seeing floaters" that have progressively gotten worse. She is not having any of these symptoms at this time.   She states she has been dealing with left calf swelling that first started at the start of her 2nd trimester. She also mentions she will randomly feel throbbing in her right calf.  2-3 days ago she started feeling lethargic and short of breath. 2 weeks ago she received 2 rounds of IV iron due to iron deficiency anemia. She has has past surgical history for gastric bypass and states she has had nutritional absorption issues before. She states she is eating and drinking with no issue. She is currently taking a prenatal vitamin, bariatric supplement, and as needed muscle relaxer. She denies any issues with her blood sugar or blood pressure during pregnancy. She denies any urinary or abdominal symptoms. Fetal HR monitoring looks great.  OB History     Gravida  7   Para  2   Term  2   Preterm      AB  4   Living  2      SAB  3   IAB  1   Ectopic      Multiple      Live Births  2           Past Medical History:  Diagnosis Date   Allergy    Depression    Herpes     Past Surgical History:  Procedure  Laterality Date   BARIATRIC SURGERY  02/10/2020   Sadi -S    Family History  Problem Relation Age of Onset   Hypertension Mother    Cancer Paternal Aunt        thyroid   Cancer Maternal Grandmother    Diabetes Paternal Grandmother     Social History   Tobacco Use   Smoking status: Never   Smokeless tobacco: Never  Vaping Use   Vaping Use: Never used  Substance Use Topics   Alcohol use: Not Currently   Drug use: Never    Allergies: No Known Allergies  Medications Prior to Admission  Medication Sig Dispense Refill Last Dose   methocarbamol (ROBAXIN) 500 MG tablet Take 500 mg by mouth 4 (four) times daily.   Past Week   Multiple Vitamin (MULTIVITAMIN) tablet Take 1 tablet by mouth daily. Bariatric vitamin   07/07/2022   valACYclovir (VALTREX) 500 MG tablet Take 500 mg by mouth 2 (two) times daily as needed. (Patient not taking: Reported on 06/27/2022)       Review of Systems  Constitutional:  Positive for activity change and fatigue. Negative for appetite change and fever.  Respiratory:  Positive for shortness of breath. Negative for cough and chest tightness.   Cardiovascular:  Positive  for leg swelling. Negative for chest pain and palpitations.       Left leg swelling  Gastrointestinal:  Positive for constipation. Negative for abdominal pain, diarrhea, nausea and vomiting.       She reports constipation is due to iron supplementation  Endocrine: Negative.   Genitourinary:  Positive for pelvic pain. Negative for dysuria, flank pain, hematuria, vaginal discharge and vaginal pain.  Musculoskeletal: Negative.   Skin: Negative.   Neurological:  Positive for dizziness, light-headedness and headaches. Negative for syncope, weakness and numbness.  Psychiatric/Behavioral: Negative.     Physical Exam   Blood pressure 125/78, pulse 76, temperature 98 F (36.7 C), temperature source Oral, resp. rate 18, weight 89 kg, last menstrual period 07/20/2021, SpO2 99 %.  Physical  Exam Constitutional:      General: She is not in acute distress.    Appearance: Normal appearance.  HENT:     Head: Normocephalic and atraumatic.  Eyes:     Extraocular Movements: Extraocular movements intact.     Conjunctiva/sclera: Conjunctivae normal.  Cardiovascular:     Rate and Rhythm: Normal rate and regular rhythm.     Pulses: Normal pulses.     Heart sounds: Normal heart sounds. No murmur heard.    No friction rub. No gallop.  Pulmonary:     Effort: Pulmonary effort is normal. No respiratory distress.     Breath sounds: Normal breath sounds.  Chest:     Chest wall: No tenderness.  Abdominal:     General: Bowel sounds are normal.     Tenderness: There is no abdominal tenderness. There is no right CVA tenderness or left CVA tenderness.  Musculoskeletal:        General: Tenderness present.     Left lower leg: Edema present.  Skin:    Capillary Refill: Capillary refill takes less than 2 seconds.     Findings: No bruising or erythema.  Neurological:     General: No focal deficit present.     Mental Status: She is alert and oriented to person, place, and time.     Sensory: No sensory deficit.  Psychiatric:        Mood and Affect: Mood normal.        Behavior: Behavior normal.     MAU Course  Procedures  MDM moderate  Assessment and Plan  Fatigue in pregnancy during third Trimester Pt just recently received 2 rounds of IV iron 2 weeks ago. Recheck CBC. RBC 3.56, Hg 9.7, RDW 18.6, MCV WNL compared to last labs 2 weeks ago. Educated Pt that IV iron supplementation can take up to 6 weeks before seeing adequate response with increase in Hg and decrease in symptoms. UA to rule out asymptomatic UTI. UA unremarkable, no leukocytes or nitrates. CMP to check electrolytes and kidney function. Potassium 2.9, replace with oral supplementation. Symptoms common during pregnancy, anemia contributing as well.  Left Lower Extremity Swelling Doppler Ultrasound of LLE negative for  DVT. Baby noticeable tilted to left, educated position of baby can affect venous return. UA no signs of proteinuria. Educated on use of compression stockings/pregnancy belt to promote venous return and help with venous pelvic congestion.  Vision Changes CMP to check for electrolyte imbalances. Correct K+. Encouraged continued hydration, increase in food intake. common symptoms during third trimester. No concern for Preeclampsia at this time, BP WNL. No issues with blood glucose during pregnancy.  Iron Deficiency Anemia Rechecked CBC to evaluate response to IV iron. Treatment plan same as 1. History  of Gastric Bypass likely affecting absorption. Anemia likely cause of majority of symptoms, educated pt on this. Would need iron panel and retic count for proper evaluation of response to iron at this point. Pt is being managed by hematology.  5. Hypokalemia K+ 2.9. Oral potassium chloride replacement. Short prescription, 5 pills. Goal K+ of 4 due to likely malabsorption of supplement and attempting to correct to more normal range.  6. Hypoalbuminemia, hypoproteinemia and Hypocalcemia Hypoalbuminemia likely due to history of gastric bypass, eating small meals. Encouraged increased protein intake, protein supplementation with shakes. Corrective calcium 9.6.  Pt understanding of current situation. All questions answered at this time. Pt already has appointment with her OB next week.  Pt to return if any new, worsening, or unknown symptoms present.   Caleb Larrimore 07/07/2022, 5:50 PM   CNM attestation:  HPI: Angie Wilcox is a 42 y.o. [phone] non-pregnant female presenting with multiple complaints listed in HPI but most notably lower extremity swelling and fatigue. She denies any abdominal pain, vaginal bleeding or discharge. Reports normal fetal movement.   ROS, labs, PMH reviewed  PE: Patient Vitals for the past 24 hrs:  BP Temp Temp src Pulse Resp SpO2 Weight  07/07/22 1907 118/75 --  -- 76 -- -- --  07/07/22 1715 125/78 -- -- 76 -- -- --  07/07/22 1709 116/74 98 F (36.7 C) Oral 74 18 99 % 89 kg   Gen: A&O x3, NAD Resp: normal effort and rate, no distress Heart: regular rate Abd: Soft, NT Pelvic exam: deferred  Fetal Tracing:  Baseline: 125 Variability: moderate Accels: 15x15 Decels: none   Toco: none   Orders Placed This Encounter  Procedures   Urinalysis, Routine w reflex microscopic -Urine, Clean Catch   CBC   Comprehensive metabolic panel   Discharge patient   VAS [place] LOWER EXTREMITY VENOUS (DVT) (ONLY [facility])   Meds ordered this encounter  Medications   potassium chloride SA (KLOR-CON M) 20 MEQ tablet    Sig: Take 1 tablet (20 mEq total) by mouth daily for 5 days.    Dispense:  5 tablet    Refill:  0    Order Specific Question:   Supervising Provider    Answer:   Verita Schneiders A [id number]    MDM Orders Placed This Encounter  Procedures   Urinalysis, Routine w reflex microscopic -Urine, Clean Catch   CBC   Comprehensive metabolic panel   Discharge patient   VAS [place] LOWER EXTREMITY VENOUS (DVT) (ONLY [facility])   Meds ordered this encounter  Medications   potassium chloride SA (KLOR-CON M) 20 MEQ tablet    Sig: Take 1 tablet (20 mEq total) by mouth daily for 5 days.    Dispense:  5 tablet    Refill:  0    Order Specific Question:   Supervising Provider    Answer:   Verita Schneiders A [id number]   Results for orders placed or performed during the hospital encounter of 07/07/22 (from the past 24 hour(s))  Urinalysis, Routine w reflex microscopic -Urine, Clean Catch     Status: Abnormal   Collection Time: 07/07/22  5:13 PM  Result Value Ref Range   Color, Urine YELLOW YELLOW   APPearance HAZY (A) CLEAR   Specific Gravity, Urine 1.008 1.005 - 1.030   pH 7.0 5.0 - 8.0   Glucose, UA NEGATIVE NEGATIVE mg/dL   Hgb urine dipstick NEGATIVE NEGATIVE   Bilirubin Urine NEGATIVE NEGATIVE   Ketones, ur  20 (A) NEGATIVE mg/dL   Protein, ur NEGATIVE  NEGATIVE mg/dL   Nitrite NEGATIVE NEGATIVE   Leukocytes,Ua NEGATIVE NEGATIVE  CBC     Status: Abnormal   Collection Time: 07/07/22  5:51 PM  Result Value Ref Range   WBC 5.5 4.0 - 10.5 K/uL   RBC 3.56 (L) 3.87 - 5.11 MIL/uL   Hemoglobin 9.7 (L) 12.0 - 15.0 g/dL   HCT 29.5 (L) 36.0 - 46.0 %   MCV 82.9 80.0 - 100.0 fL   MCH 27.2 26.0 - 34.0 pg   MCHC 32.9 30.0 - 36.0 g/dL   RDW 18.6 (H) 11.5 - 15.5 %   Platelets 226 150 - 400 K/uL   nRBC 0.0 0.0 - 0.2 %  Comprehensive metabolic panel     Status: Abnormal   Collection Time: 07/07/22  5:51 PM  Result Value Ref Range   Sodium 137 135 - 145 mmol/L   Potassium 2.9 (L) 3.5 - 5.1 mmol/L   Chloride 103 98 - 111 mmol/L   CO2 24 22 - 32 mmol/L   Glucose, Bld 73 70 - 99 mg/dL   BUN 5 (L) 6 - 20 mg/dL   Creatinine, Ser 0.58 0.44 - 1.00 mg/dL   Calcium 8.4 (L) 8.9 - 10.3 mg/dL   Total Protein 6.0 (L) 6.5 - 8.1 g/dL   Albumin 2.5 (L) 3.5 - 5.0 g/dL   AST 44 (H) 15 - 41 U/L   ALT 40 0 - 44 U/L   Alkaline Phosphatase 84 38 - 126 U/L   Total Bilirubin 0.7 0.3 - 1.2 mg/dL   GFR, Estimated >60 >60 mL/min   Anion gap 10 5 - 15    VAS [place] LOWER EXTREMITY VENOUS (DVT) (ONLY [facility])  Result Date: 07/07/2022  Lower Venous DVT Study Patient Name:  MADYSYN ANNABLE  Date of Exam:   07/07/2022 Medical Rec #: [id number]          Accession #:    [id number] Date of Birth: Mar 05, 1981          Patient Gender: F Patient Age:   4 years Exam Location:  [facility] Procedure:      VAS [place] LOWER EXTREMITY VENOUS (DVT) Referring Phys: Sallyann Kinnaird --------------------------------------------------------------------------------  Indications: Swelling.  Risk Factors: Current pregnancy. Comparison Study: No prior studies. Performing Technologist: Oliver Hum RVT  Examination Guidelines: A complete evaluation includes B-mode imaging, spectral Doppler, color Doppler, and power Doppler as needed of all accessible portions of each vessel. Bilateral testing is  considered an integral part of a complete examination. Limited examinations for reoccurring indications may be performed as noted. The reflux portion of the exam is performed with the patient in reverse Trendelenburg.  +-----+---------------+---------+-----------+----------+--------------+ RIGHTCompressibilityPhasicitySpontaneityPropertiesThrombus Aging +-----+---------------+---------+-----------+----------+--------------+ CFV  Full           Yes      Yes                                 +-----+---------------+---------+-----------+----------+--------------+   +---------+---------------+---------+-----------+----------+--------------+ LEFT     CompressibilityPhasicitySpontaneityPropertiesThrombus Aging +---------+---------------+---------+-----------+----------+--------------+ CFV      Full           Yes      Yes                                 +---------+---------------+---------+-----------+----------+--------------+ SFJ      Full                                                        +---------+---------------+---------+-----------+----------+--------------+  FV Prox  Full                                                        +---------+---------------+---------+-----------+----------+--------------+ FV Mid   Full                                                        +---------+---------------+---------+-----------+----------+--------------+ FV DistalFull                                                        +---------+---------------+---------+-----------+----------+--------------+ PFV      Full                                                        +---------+---------------+---------+-----------+----------+--------------+ POP      Full           Yes      Yes                                 +---------+---------------+---------+-----------+----------+--------------+ PTV      Full                                                         +---------+---------------+---------+-----------+----------+--------------+ PERO     Full                                                        +---------+---------------+---------+-----------+----------+--------------+     Summary: RIGHT: - No evidence of common femoral vein obstruction.  LEFT: - There is no evidence of deep vein thrombosis in the lower extremity.  - No cystic structure found in the popliteal fossa.  *See table(s) above for measurements and observations.    Preliminary      Assessment 1. Hypokalemia due to excessive gastrointestinal loss of potassium   2. [redacted] weeks gestation of pregnancy   3. Lower extremity edema     Plan: - Discharge home in stable condition - Follow-up at [facility] - Return to maternity admissions if symptoms worsen  I confirm that I have verified the information documented in the student's note. I personally was present during the history, physical exam, and medical decision-making activities of this service and have verified that the service and findings are accurately documented in the student's note.  Wende Mott, [place] 07/07/2022 7:10 PM

## 2022-07-07 NOTE — Discharge Instructions (Signed)

## 2022-07-07 NOTE — Progress Notes (Signed)
Left lower extremity venous duplex has been completed. Preliminary results can be found in CV Proc through chart review.  Results were given to [name] CNM.  07/07/22 6:07 PM Angie Wilcox RVT

## 2022-07-07 NOTE — MAU Note (Addendum)
.  Angie Wilcox is a 42 y.o. at 6w4dhere in MAU reporting: feeling tired and lethargic, seeing bright spots, blurry vision, HA, and BLE swelling. Reports swelling in her legs; the swelling has been on-going since her second trimester, but has gotten worse and painful in the last week (6/10). The swelling is worse in her left leg. She also reports intermittent pelvic pain for which she is doing pelvic floor therapy (5/10). HA (3/10) that started last night and has been intermittent, has not taken anything for it. She also reports intermittent blurry vision and seeing white spots in her vision. Sometimes her heart rate increases and she feels SOB, regardless of activity (has happened once today). Denies VB or LOF. Reports good FM.  LMP: N/A Onset of complaint: On-going Pain score: /10 Vitals:   07/07/22 1709  BP: 116/74  Pulse: 74  Resp: 18  Temp: 98 F (36.7 C)  SpO2: 99%     FHT:145 Lab orders placed from triage:  UA

## 2022-07-10 ENCOUNTER — Other Ambulatory Visit: Payer: Self-pay

## 2022-07-11 ENCOUNTER — Ambulatory Visit: Payer: BC Managed Care – PPO | Admitting: *Deleted

## 2022-07-11 ENCOUNTER — Ambulatory Visit: Payer: BC Managed Care – PPO | Attending: Obstetrics

## 2022-07-11 ENCOUNTER — Other Ambulatory Visit: Payer: Self-pay | Admitting: *Deleted

## 2022-07-11 VITALS — BP 124/74 | HR 81

## 2022-07-11 DIAGNOSIS — O2620 Pregnancy care for patient with recurrent pregnancy loss, unspecified trimester: Secondary | ICD-10-CM

## 2022-07-11 DIAGNOSIS — Z8632 Personal history of gestational diabetes: Secondary | ICD-10-CM

## 2022-07-11 DIAGNOSIS — O09523 Supervision of elderly multigravida, third trimester: Secondary | ICD-10-CM

## 2022-07-11 DIAGNOSIS — O2623 Pregnancy care for patient with recurrent pregnancy loss, third trimester: Secondary | ICD-10-CM

## 2022-07-11 DIAGNOSIS — Z9884 Bariatric surgery status: Secondary | ICD-10-CM | POA: Diagnosis present

## 2022-07-11 DIAGNOSIS — O99843 Bariatric surgery status complicating pregnancy, third trimester: Secondary | ICD-10-CM | POA: Diagnosis not present

## 2022-07-11 DIAGNOSIS — E669 Obesity, unspecified: Secondary | ICD-10-CM

## 2022-07-11 DIAGNOSIS — Z3A35 35 weeks gestation of pregnancy: Secondary | ICD-10-CM

## 2022-07-11 DIAGNOSIS — O99213 Obesity complicating pregnancy, third trimester: Secondary | ICD-10-CM

## 2022-07-11 DIAGNOSIS — O24113 Pre-existing diabetes mellitus, type 2, in pregnancy, third trimester: Secondary | ICD-10-CM

## 2022-07-11 DIAGNOSIS — O09293 Supervision of pregnancy with other poor reproductive or obstetric history, third trimester: Secondary | ICD-10-CM | POA: Diagnosis not present

## 2022-07-18 ENCOUNTER — Ambulatory Visit: Payer: BC Managed Care – PPO | Admitting: *Deleted

## 2022-07-18 ENCOUNTER — Ambulatory Visit: Payer: BC Managed Care – PPO | Attending: Obstetrics

## 2022-07-18 VITALS — BP 122/72 | HR 75

## 2022-07-18 DIAGNOSIS — O99213 Obesity complicating pregnancy, third trimester: Secondary | ICD-10-CM | POA: Diagnosis present

## 2022-07-18 DIAGNOSIS — Z9884 Bariatric surgery status: Secondary | ICD-10-CM | POA: Insufficient documentation

## 2022-07-18 DIAGNOSIS — E669 Obesity, unspecified: Secondary | ICD-10-CM

## 2022-07-18 DIAGNOSIS — O2623 Pregnancy care for patient with recurrent pregnancy loss, third trimester: Secondary | ICD-10-CM | POA: Diagnosis not present

## 2022-07-18 DIAGNOSIS — O09523 Supervision of elderly multigravida, third trimester: Secondary | ICD-10-CM

## 2022-07-18 DIAGNOSIS — O99843 Bariatric surgery status complicating pregnancy, third trimester: Secondary | ICD-10-CM

## 2022-07-18 DIAGNOSIS — Z3A36 36 weeks gestation of pregnancy: Secondary | ICD-10-CM

## 2022-07-18 DIAGNOSIS — O09293 Supervision of pregnancy with other poor reproductive or obstetric history, third trimester: Secondary | ICD-10-CM | POA: Diagnosis not present

## 2022-07-18 DIAGNOSIS — Z8632 Personal history of gestational diabetes: Secondary | ICD-10-CM

## 2022-07-19 ENCOUNTER — Telehealth: Payer: Self-pay | Admitting: Family Medicine

## 2022-07-19 NOTE — Telephone Encounter (Signed)
Pt called and stated she is expecting a baby 3.31.24 and she wanted to know are you willing to take the baby as a new pt. Because you see her and her other children.

## 2022-07-21 ENCOUNTER — Encounter: Payer: Self-pay | Admitting: *Deleted

## 2022-07-22 ENCOUNTER — Institutional Professional Consult (permissible substitution): Payer: BC Managed Care – PPO | Admitting: Pediatrics

## 2022-07-25 ENCOUNTER — Ambulatory Visit: Payer: BC Managed Care – PPO | Admitting: *Deleted

## 2022-07-25 ENCOUNTER — Ambulatory Visit: Payer: BC Managed Care – PPO | Attending: Maternal & Fetal Medicine | Admitting: *Deleted

## 2022-07-25 VITALS — BP 121/73 | HR 87

## 2022-07-25 DIAGNOSIS — O09523 Supervision of elderly multigravida, third trimester: Secondary | ICD-10-CM | POA: Diagnosis not present

## 2022-07-25 DIAGNOSIS — Z3A37 37 weeks gestation of pregnancy: Secondary | ICD-10-CM | POA: Diagnosis not present

## 2022-07-25 NOTE — Procedures (Addendum)
JOCABED BOSTON [birth year] [redacted]w[redacted]d Fetus A Non-Stress Test Interpretation for 07/25/22  Indication: Advanced Maternal Age >40 years  Fetal Heart Rate A Mode: External Baseline Rate (A): 130 bpm Variability: Moderate Accelerations: 15 x 15 Decelerations: None Multiple birth?: No  Uterine Activity Mode: Palpation, Toco Contraction Frequency (min): 1 uc Contraction Duration (sec): 40 Contraction Quality: Mild Resting Tone Palpated: Relaxed Resting Time: Adequate  Interpretation (Fetal Testing) Nonstress Test Interpretation: Reactive Overall Impression: Reassuring for gestational age Comments: Dr. SEpimenio Sarinreviewed tracing

## 2022-07-29 ENCOUNTER — Other Ambulatory Visit: Payer: Self-pay | Admitting: Obstetrics and Gynecology

## 2022-07-29 DIAGNOSIS — Z3490 Encounter for supervision of normal pregnancy, unspecified, unspecified trimester: Secondary | ICD-10-CM

## 2022-07-30 ENCOUNTER — Inpatient Hospital Stay (HOSPITAL_COMMUNITY)
Admission: AD | Admit: 2022-07-30 | Discharge: 2022-07-31 | DRG: 805 | Disposition: A | Discharge status: Home / Self Care | Payer: BC Managed Care – PPO | Attending: Obstetrics and Gynecology | Admitting: Obstetrics and Gynecology

## 2022-07-30 ENCOUNTER — Other Ambulatory Visit: Payer: Self-pay

## 2022-07-30 ENCOUNTER — Inpatient Hospital Stay (HOSPITAL_COMMUNITY): Payer: BC Managed Care – PPO | Admitting: Anesthesiology

## 2022-07-30 ENCOUNTER — Encounter (HOSPITAL_COMMUNITY): Payer: Self-pay | Admitting: Obstetrics and Gynecology

## 2022-07-30 DIAGNOSIS — O9832 Other infections with a predominantly sexual mode of transmission complicating childbirth: Secondary | ICD-10-CM | POA: Diagnosis present

## 2022-07-30 DIAGNOSIS — O9902 Anemia complicating childbirth: Secondary | ICD-10-CM | POA: Diagnosis present

## 2022-07-30 DIAGNOSIS — O2412 Pre-existing diabetes mellitus, type 2, in childbirth: Secondary | ICD-10-CM | POA: Diagnosis present

## 2022-07-30 DIAGNOSIS — O99844 Bariatric surgery status complicating childbirth: Secondary | ICD-10-CM | POA: Diagnosis present

## 2022-07-30 DIAGNOSIS — Z794 Long term (current) use of insulin: Secondary | ICD-10-CM | POA: Diagnosis not present

## 2022-07-30 DIAGNOSIS — A6000 Herpesviral infection of urogenital system, unspecified: Secondary | ICD-10-CM | POA: Diagnosis present

## 2022-07-30 DIAGNOSIS — Z3A37 37 weeks gestation of pregnancy: Secondary | ICD-10-CM

## 2022-07-30 DIAGNOSIS — Z87442 Personal history of urinary calculi: Secondary | ICD-10-CM | POA: Diagnosis not present

## 2022-07-30 DIAGNOSIS — D62 Acute posthemorrhagic anemia: Secondary | ICD-10-CM | POA: Diagnosis not present

## 2022-07-30 DIAGNOSIS — O26893 Other specified pregnancy related conditions, third trimester: Secondary | ICD-10-CM | POA: Diagnosis present

## 2022-07-30 DIAGNOSIS — E119 Type 2 diabetes mellitus without complications: Secondary | ICD-10-CM | POA: Diagnosis present

## 2022-07-30 DIAGNOSIS — O4202 Full-term premature rupture of membranes, onset of labor within 24 hours of rupture: Secondary | ICD-10-CM

## 2022-07-30 DIAGNOSIS — O99214 Obesity complicating childbirth: Secondary | ICD-10-CM | POA: Diagnosis present

## 2022-07-30 LAB — CBC
HCT: 33.7 % — ABNORMAL LOW (ref 36.0–46.0)
Hemoglobin: 11.3 g/dL — ABNORMAL LOW (ref 12.0–15.0)
MCH: 29.1 pg (ref 26.0–34.0)
MCHC: 33.5 g/dL (ref 30.0–36.0)
MCV: 86.9 fL (ref 80.0–100.0)
Platelets: 243 10*3/uL (ref 150–400)
RBC: 3.88 MIL/uL (ref 3.87–5.11)
RDW: 19.9 % — ABNORMAL HIGH (ref 11.5–15.5)
WBC: 4.9 10*3/uL (ref 4.0–10.5)
nRBC: 0 % (ref 0.0–0.2)

## 2022-07-30 LAB — HIV ANTIBODY (ROUTINE TESTING W REFLEX): HIV Screen 4th Generation wRfx: NONREACTIVE

## 2022-07-30 LAB — TYPE AND SCREEN
ABO/RH(D): A POS
Antibody Screen: NEGATIVE

## 2022-07-30 LAB — POCT FERN TEST

## 2022-07-30 LAB — OB RESULTS CONSOLE GBS: GBS: NEGATIVE

## 2022-07-30 MED ORDER — ONDANSETRON HCL 4 MG PO TABS: 4.0000 mg | ORAL_TABLET | ORAL | Status: DC | PRN

## 2022-07-30 MED ORDER — OXYCODONE HCL 5 MG PO TABS
5.0000 mg | ORAL_TABLET | ORAL | Status: DC | PRN
  Administered 2022-07-31: 5 mg via ORAL
  Filled 2022-07-30: qty 1

## 2022-07-30 MED ORDER — LACTATED RINGERS IV SOLN
500.0000 mL | Freq: Once | INTRAVENOUS | Status: AC
  Administered 2022-07-30: 500 mL via INTRAVENOUS

## 2022-07-30 MED ORDER — HYDROXYZINE HCL 50 MG PO TABS: 50.0000 mg | ORAL_TABLET | Freq: Four times a day (QID) | ORAL | Status: DC | PRN

## 2022-07-30 MED ORDER — DIPHENHYDRAMINE HCL 25 MG PO CAPS: 25.0000 mg | ORAL_CAPSULE | Freq: Four times a day (QID) | ORAL | Status: DC | PRN

## 2022-07-30 MED ORDER — VALACYCLOVIR HCL 500 MG PO TABS: 500.0000 mg | ORAL_TABLET | Freq: Two times a day (BID) | ORAL | Status: DC

## 2022-07-30 MED ORDER — SENNOSIDES-DOCUSATE SODIUM 8.6-50 MG PO TABS
2.0000 | ORAL_TABLET | Freq: Every day | ORAL | Status: DC
  Administered 2022-07-31: 2 via ORAL
  Filled 2022-07-30: qty 2

## 2022-07-30 MED ORDER — DIBUCAINE (PERIANAL) 1 % EX OINT: 1.0000 | TOPICAL_OINTMENT | CUTANEOUS | Status: DC | PRN

## 2022-07-30 MED ORDER — OXYCODONE-ACETAMINOPHEN 5-325 MG PO TABS
1.0000 | ORAL_TABLET | ORAL | Status: DC | PRN
  Administered 2022-07-30: 1 via ORAL
  Filled 2022-07-30: qty 1

## 2022-07-30 MED ORDER — TETANUS-DIPHTH-ACELL PERTUSSIS 5-2.5-18.5 LF-MCG/0.5 IM SUSY: 0.5000 mL | PREFILLED_SYRINGE | Freq: Once | INTRAMUSCULAR | Status: DC

## 2022-07-30 MED ORDER — OXYCODONE-ACETAMINOPHEN 5-325 MG PO TABS: 2.0000 | ORAL_TABLET | ORAL | Status: DC | PRN

## 2022-07-30 MED ORDER — ONDANSETRON HCL 4 MG/2ML IJ SOLN: 4.0000 mg | INTRAMUSCULAR | Status: DC | PRN

## 2022-07-30 MED ORDER — OXYTOCIN-SODIUM CHLORIDE 30-0.9 UT/500ML-% IV SOLN
2.5000 [IU]/h | INTRAVENOUS | Status: DC
  Filled 2022-07-30: qty 500

## 2022-07-30 MED ORDER — ONDANSETRON HCL 4 MG/2ML IJ SOLN: 4.0000 mg | Freq: Four times a day (QID) | INTRAMUSCULAR | Status: DC | PRN

## 2022-07-30 MED ORDER — WITCH HAZEL-GLYCERIN EX PADS: 1.0000 | MEDICATED_PAD | CUTANEOUS | Status: DC | PRN

## 2022-07-30 MED ORDER — SOD CITRATE-CITRIC ACID 500-334 MG/5ML PO SOLN: 30.0000 mL | ORAL | Status: DC | PRN

## 2022-07-30 MED ORDER — IBUPROFEN 600 MG PO TABS: 600.0000 mg | ORAL_TABLET | Freq: Four times a day (QID) | ORAL | Status: DC

## 2022-07-30 MED ORDER — ACETAMINOPHEN 325 MG PO TABS
650.0000 mg | ORAL_TABLET | ORAL | Status: DC | PRN
  Administered 2022-07-30: 650 mg via ORAL
  Filled 2022-07-30: qty 2

## 2022-07-30 MED ORDER — COCONUT OIL OIL: 1.0000 | TOPICAL_OIL | Status: DC | PRN

## 2022-07-30 MED ORDER — PHENYLEPHRINE 80 MCG/ML (10ML) SYRINGE FOR IV PUSH (FOR BLOOD PRESSURE SUPPORT): 80.0000 ug | PREFILLED_SYRINGE | INTRAVENOUS | Status: DC | PRN

## 2022-07-30 MED ORDER — FENTANYL CITRATE (PF) 100 MCG/2ML IJ SOLN: 50.0000 ug | INTRAMUSCULAR | Status: DC | PRN

## 2022-07-30 MED ORDER — OXYTOCIN BOLUS FROM INFUSION
333.0000 mL | Freq: Once | INTRAVENOUS | Status: AC
  Administered 2022-07-30: 333 mL via INTRAVENOUS

## 2022-07-30 MED ORDER — ZOLPIDEM TARTRATE 5 MG PO TABS: 5.0000 mg | ORAL_TABLET | Freq: Every evening | ORAL | Status: DC | PRN

## 2022-07-30 MED ORDER — BENZOCAINE-MENTHOL 20-0.5 % EX AERO: 1.0000 | INHALATION_SPRAY | CUTANEOUS | Status: DC | PRN

## 2022-07-30 MED ORDER — LIDOCAINE HCL (PF) 1 % IJ SOLN
30.0000 mL | INTRAMUSCULAR | Status: AC | PRN
  Administered 2022-07-30: 30 mL via SUBCUTANEOUS
  Filled 2022-07-30: qty 30

## 2022-07-30 MED ORDER — ACETAMINOPHEN 325 MG PO TABS
650.0000 mg | ORAL_TABLET | ORAL | Status: DC | PRN
  Administered 2022-07-31 (×3): 650 mg via ORAL
  Filled 2022-07-30 (×3): qty 2

## 2022-07-30 MED ORDER — PRENATAL MULTIVITAMIN CH
1.0000 | ORAL_TABLET | Freq: Every day | ORAL | Status: DC
  Administered 2022-07-31: 1 via ORAL
  Filled 2022-07-30: qty 1

## 2022-07-30 MED ORDER — EPHEDRINE 5 MG/ML INJ: 10.0000 mg | INTRAVENOUS | Status: DC | PRN

## 2022-07-30 MED ORDER — SIMETHICONE 80 MG PO CHEW: 80.0000 mg | CHEWABLE_TABLET | ORAL | Status: DC | PRN

## 2022-07-30 MED ORDER — LACTATED RINGERS IV SOLN: INTRAVENOUS | Status: DC

## 2022-07-30 MED ORDER — FENTANYL-BUPIVACAINE-NACL 0.5-0.125-0.9 MG/250ML-% EP SOLN
12.0000 mL/h | EPIDURAL | Status: DC | PRN
  Filled 2022-07-30: qty 250

## 2022-07-30 MED ORDER — DIPHENHYDRAMINE HCL 50 MG/ML IJ SOLN: 12.5000 mg | INTRAMUSCULAR | Status: DC | PRN

## 2022-07-30 MED ORDER — LACTATED RINGERS IV SOLN: 500.0000 mL | INTRAVENOUS | Status: DC | PRN

## 2022-07-30 NOTE — Anesthesia Preprocedure Evaluation (Deleted)
Anesthesia Evaluation    Airway        Dental   Pulmonary           Cardiovascular      Neuro/Psych    GI/Hepatic   Endo/Other    Renal/GU      Musculoskeletal   Abdominal   Peds  Hematology   Anesthesia Other Findings   Reproductive/Obstetrics                             Anesthesia Physical Anesthesia Plan Anesthesia Quick Evaluation  

## 2022-07-30 NOTE — H&P (Signed)
HPI: 42 y.o. [id number] @ [redacted]w[redacted]d estimated gestational age (as dated by LMP c/w 7 week ultrasound) presents for SROM/labor.  Reports SROM (clear) at 1330. Starting to feel discomfort with contractions now. Has a doula coming for labor support. 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 Considering epidural.  Leakage of fluid:  Yes Vaginal bleeding:  No Contractions:  Yes Fetal movement:  Yes  Prenatal care has been provided by [facility].  ROS:  Denies fevers, chills, chest pain, visual changes, SOB, RUQ/epigastric pain, N/V, dysuria, hematuria, or sudden onset/worsening bilateral LE or facial edema.  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) Anemia (follows with [facility], has had IV iron this pregnancy) History of bariatric surgery (s/p SADI-S in 2021) AMA (41) Hx gestational diabetes Fibrocystic breast changes (MRI, biopsy benign in 2023) Hx recurrent miscarriage  Prenatal Transfer Tool  Maternal Diabetes: No Genetic Screening: Normal - Low risk female panorama Maternal Ultrasounds/Referrals: Normal Fetal Ultrasounds or other Referrals:  Referred to Materal Fetal Medicine  Maternal Substance Abuse:  No Significant Maternal Medications:  None Significant Maternal Lab Results: Group B Strep negative   Prenatal Labs Blood type:  A Positive Antibody screen:  Negative CBC:  H/H 9.7/29.5 (2/22) Rubella: Immune RPR:  Non-reactive Hep B:  Negative Hep C:  Negative HIV:  Negative GC/CT:  Negative Glucola: Deferred due to bariatric surgery history HgbA1C: 5.5   OBHx:  OB History     Gravida  7   Para  2   Term  2   Preterm      AB  4   Living  2      SAB  3   IAB  1   Ectopic      Multiple      Live Births  2          PMHx:  See above Meds:  PNV Allergy:  No Known Allergies SurgHx: Bariatric surgery SocHx:   Denies Tobacco, ETOH, illicit  drugs  O: BP [id number] (BP Location: Right Arm)   Pulse 84   Temp 98.9 F (37.2 C)   Resp 18   Ht 5' 6.5" (1.689 m)   Wt 92.1 kg   LMP 07/20/2021   SpO2 98%   BMI 32.29 kg/m  Gen. AAOx3, NAD CV.  RRR  Resp. CTAB, no wheezes/rales/rhonchi Abd. Gravid, soft, non-tender throughout, no rebound/guarding Extr.  N bilateral LE edema, no calf tenderness bilaterally SVE: 5/70/-2 by primary RN in MAU at 1415 SSE: No vulvar/vaginal/cervical lesions.  No blood visualized in vaginal vault. Cervix appeared closed. Positive Pooling of clear fluid.  Last [place]:   Narrative & Impression  ----------------------------------------------------------------------  OBSTETRICS REPORT                       (Signed Final 07/18/2022 08:23 am) ---------------------------------------------------------------------- Patient Info  ID #:       [id number]                          D.O.B.:  07-16-1980 (41 yrs)  Name:       Angie Wilcox               Visit Date: 07/18/2022 07:25 am ---------------------------------------------------------------------- Performed By  Attending:        Tama High MD        Ref. Address:  [facility] &                                                             Gynecology                                                             [place]                                                             [place], [place]  Performed By:     Stephenie Acres        Location:         [facility]                    BS RDMS                                  Fetal [facility] at                                                             [place] [facility]   Referred By:      Donnel Saxon                    CNM ---------------------------------------------------------------------- Orders  #  Description                           Code        Ordered By  1  [place] MFM FETAL BPP WO NON               76819.01    YU FANG     STRESS ----------------------------------------------------------------------  #  Order #                     Accession #                Episode #  1  [id number]                   [id number]                 [id number] ---------------------------------------------------------------------- Indications  Advanced maternal age multigravida 62+,        O35.523  third trimester  Pregnancy complicated by previous bariatric    O99.843  surgery, antepartum, third trimester  Poor obstetric history-Recurrent (habitual)    O26.20  abortion  Poor obstetric history: Previous gestational   O09.299  diabetes requiring metformin, insulin  Obesity complicating pregnancy, third          O99.213  trimester (BMI 30)  NIPS LR female, Horizon negative  [redacted] weeks gestation of pregnancy                Z3A.36 ---------------------------------------------------------------------- Fetal Evaluation  Num Of Fetuses:         1  Fetal Heart Rate(bpm):  144  Cardiac Activity:       Observed  Presentation:           Cephalic  Placenta:               Posterior  P. Cord Insertion:      Previously visualized  Amniotic Fluid  AFI FV:      Subjectively upper-normal  AFI Sum(cm)     %Tile       Largest Pocket(cm)  24.56           94          8.7  RUQ(cm)       RLQ(cm)       LUQ(cm)        LLQ(cm)  4.67          8.7           5.08           6.11 ---------------------------------------------------------------------- Biophysical Evaluation  Amniotic F.V:   Pocket => 2 cm             F. Tone:        Observed  F. Movement:    Observed                   Score:          8/8  F. Breathing:    Observed ---------------------------------------------------------------------- OB History  Gravidity:    7         Term:   2         SAB:   3  TOP:  1        Living:  2 ---------------------------------------------------------------------- Gestational Age  LMP:           35w 6d        Date:  07/20/21                   EDD:   04/26/22  Best:          36w 1d     Det. ByLoman Chroman         EDD:   08/14/22                                      (01/21/22) ---------------------------------------------------------------------- Anatomy  Cranium:               Appears normal         Aortic Arch:            Previously seen  Cavum:                 Previously             Ductal Arch:            Previously seen                         visualized  Ventricles:            Previously seen        Diaphragm:              Appears normal  Choroid Plexus:        Previously seen        Stomach:                Appears normal, left                                                                        sided  Cerebellum:            Previously seen        Abdomen:                Appears normal  Posterior Fossa:       Previously seen        Abdominal Wall:         Previously seen  Nuchal Fold:           Previously seen        Cord Vessels:           Appears normal (3                                                                        vessel cord)  Face:                  Orbits and profile  Kidneys:                Appear normal                         previously seen  Lips:                  Previously seen        Bladder:                Appears normal  Thoracic:              Appears normal         Spine:                  Previously seen  Heart:                 Previously seen        Upper Extremities:      Previously seen  RVOT:                  Previously seen        Lower Extremities:      Previously seen  LVOT:                  Previously seen  Other:  Anatomy prev  seen ---------------------------------------------------------------------- Impression  Advanced maternal age (48 years at delivery).  Amniotic fluid is in the upper limit of normal.  Good fetal  activity seen.  Antenatal testing is reassuring.  BPP 8/8.  Cephalic presentation. ---------------------------------------------------------------------- Recommendations  -NST next week.  -Fetal growth assessment and BPP in 2 weeks.  -Recommend delivery at 84 weeks' gestation. ----------------------------------------------------------------------                 Tama High, MD Electronically Signed Final Report   07/18/2022 08:23 am ----------------------------------------------------------------------     Last growth [place]: Narrative & Impression  ----------------------------------------------------------------------  OBSTETRICS REPORT                       (Signed Final 06/27/2022 09:17 am) ---------------------------------------------------------------------- Patient Info  ID #:       [id number]                          D.O.B.:  01-Feb-1981 (41 yrs)  Name:       Angie Wilcox               Visit Date: 06/27/2022 08:36 am ---------------------------------------------------------------------- Performed By  Attending:        Johnell Comings MD         Ref. Address:     [facility] &                                                             Gynecology  [place]                                                             [place], [place]  Performed By:     Rodrigo Ran BS      Location:         [facility]                    RDMS RVT                                  Fetal Care at                                                             [place] for                                                             Women  Referred By:      Donnel Saxon                    CNM ---------------------------------------------------------------------- Orders  #  Description                           Code        Ordered By  1  [place] MFM OB FOLLOW UP                   [id number]    Peterson Ao ----------------------------------------------------------------------  #  Order #                     Accession #  Episode #  1  [id number]                   [id number]                 [id number] ---------------------------------------------------------------------- Indications  Advanced maternal age multigravida 19+,        O78.523  third trimester  Pregnancy complicated by previous bariatric    O99.843  surgery, antepartum, third trimester  Poor obstetric history-Recurrent (habitual)    O26.20  abortion  [redacted] weeks gestation of pregnancy                Z3A.33  Poor obstetric history: Previous gestational   O09.299  diabetes requiring metformin, insulin  Encounter for other antenatal screening        Z36.2  follow-up  NIPS LR female, Horizon negative ---------------------------------------------------------------------- Fetal Evaluation  Num Of Fetuses:         1  Fetal Heart Rate(bpm):  148  Cardiac Activity:       Observed  Presentation:           Cephalic  Placenta:               Posterior  P. Cord Insertion:      Previously visualized  Amniotic Fluid  AFI FV:      Within normal limits  AFI Sum(cm)     %Tile       Largest Pocket(cm)  14.53           51          4.99  RUQ(cm)       RLQ(cm)       LUQ(cm)        LLQ(cm)  4.03          2.97          2.54           4.99 ---------------------------------------------------------------------- Biometry  BPD:        79  mm     G. Age:  31w 5d         10  %    CI:        74.73   %    70 - 86                                                           FL/HC:      21.7   %    19.9 - 21.5  HC:       290   mm     G. Age:  32w 0d        2.6  %    HC/AC:      0.94        0.96 - 1.11  AC:      308.7  mm     G. Age:  34w 6d         91  %    FL/BPD:     79.6   %    71 - 87  FL:       62.9  mm     G. Age:  32w 4d         23  %    FL/AC:      20.4   %  20 - 24  LV:        4.4  mm  Est. FW:    2227  gm    4 lb 15 oz      55  % ---------------------------------------------------------------------- OB History  Gravidity:    7         Term:   2         SAB:   3  TOP:          1        Living:  2 ---------------------------------------------------------------------- Gestational Age  LMP:           48w 6d        Date:  07/20/21                   EDD:   04/26/22  U/S Today:     32w 6d                                        EDD:   08/16/22  Best:          33w 1d     Det. ByLoman Chroman         EDD:   08/14/22                                      (01/21/22) ---------------------------------------------------------------------- Anatomy  Cranium:               Appears normal         Aortic Arch:            Previously seen  Cavum:                 Appears normal         Ductal Arch:            Previously seen  Ventricles:            Appears normal         Diaphragm:              Previously seen  Choroid Plexus:        Appears normal         Stomach:                Appears normal, left                                                                        sided  Cerebellum:            Appears normal         Abdomen:                Appears normal  Posterior Fossa:       Appears normal         Abdominal Wall:         Previously seen  Nuchal Fold:           Previously seen        Cord Vessels:  Previously seen  Face:                  Appears normal         Kidneys:                Appear normal                         (orbits and profile)  Lips:                  Previously seen        Bladder:                 Appears normal  Thoracic:              Appears normal         Spine:                  Previously seen  Heart:                 Appears normal         Upper Extremities:      Previously seen                         (4CH, axis, and                         situs)  RVOT:                  Previously seen        Lower Extremities:      Previously seen  LVOT:                  Previously seen  Other:  Female gender previously seen.Heels/feet and open hands/5th digits          prev visualized. Nasal bone, lenses, maxilla, mandible and falx prev          visualized VC, 3VV and 3VTV prev visualized. ---------------------------------------------------------------------- Cervix Uterus Adnexa  Cervix  Not visualized (advanced GA >24wks)  Uterus  No abnormality visualized.  Right Ovary  Not visualized.  Left Ovary  Not visualized.  Cul De Sac  No free fluid seen.  Adnexa  No abnormality visualized ---------------------------------------------------------------------- Comments  This patient was seen for a follow up growth scan due to  advanced maternal age (42 years old) and history of gastric  bypass surgery.  She denies any problems since her last  exam.  She was informed that the fetal growth and amniotic fluid  level appears appropriate for her gestational age.  Fetal movements and fetal breathing movements were noted  throughout today's exam.  She will return in 1 week for a BPP. ----------------------------------------------------------------------                   Johnell Comings, MD Electronically Signed Final Report   06/27/2022 09:17 am ----------------------------------------------------------------------    FHT: 135 baseline, moderate variability, + accels,  - decels Toco: q3 min   Labs: see orders  A/P:  42 y.o. [id number] @ [redacted]w[redacted]d who is admitted for SROM/labor.  - Admit to L&D - Admit labs (CBC, T&S, RPR) - CEFM/Toco - FWB:  Category I - Diet:  Clear liquids - IVF:  LR at  125cc/hour - VTE Prophylaxis:  SCDs - GBS Status:  Negative - Presentation:  Cephalic by sutures -  Pain control:  Per patient request - HSV-2 - no lesions on exam, has been on Valtrex supression - Anticipate SVD  Drema Dallas, DO

## 2022-07-30 NOTE — Progress Notes (Signed)
Patient was seen at bedside just after delivery as I was in the operating room. Mom and baby doing well.  Drema Dallas, DO

## 2022-07-30 NOTE — MAU Note (Signed)
Angie Wilcox is a 42 y.o. at [redacted]w[redacted]d here in MAU reporting: started leaking at 1330, clear fluid- still coming. No bleeding.  Hasn't had any pain, discomfort just starting. Reports +FM Onset of complaint: 1330 Pain score: 4 Vitals:   07/30/22 1414  BP: 127/80  Pulse: 84  Resp: 18  Temp: 98.9 F (37.2 C)  SpO2: 98%     FHT:142 Lab orders placed from triage:

## 2022-07-31 DIAGNOSIS — D62 Acute posthemorrhagic anemia: Secondary | ICD-10-CM | POA: Diagnosis not present

## 2022-07-31 LAB — CBC
HCT: 28.6 % — ABNORMAL LOW (ref 36.0–46.0)
Hemoglobin: 9.7 g/dL — ABNORMAL LOW (ref 12.0–15.0)
MCH: 28.8 pg (ref 26.0–34.0)
MCHC: 33.9 g/dL (ref 30.0–36.0)
MCV: 84.9 fL (ref 80.0–100.0)
Platelets: 206 10*3/uL (ref 150–400)
RBC: 3.37 MIL/uL — ABNORMAL LOW (ref 3.87–5.11)
RDW: 20.1 % — ABNORMAL HIGH (ref 11.5–15.5)
WBC: 9 10*3/uL (ref 4.0–10.5)
nRBC: 0 % (ref 0.0–0.2)

## 2022-07-31 LAB — RPR: RPR Ser Ql: NONREACTIVE

## 2022-07-31 MED ORDER — POLYSACCHARIDE IRON COMPLEX 150 MG PO CAPS
150.0000 mg | ORAL_CAPSULE | Freq: Every day | ORAL | Status: DC
  Administered 2022-07-31: 150 mg via ORAL
  Filled 2022-07-31: qty 1

## 2022-07-31 NOTE — Progress Notes (Addendum)
CSW was consulted due to history of depression. CSW met with MOB at bedside to complete assessment. When CSW entered room, MOB was observed holding infant. CSW introduced self and explained reason for consult. MOB stated that she does not have a history of depression and states she did not experience postpartum depression symptoms after the birth of her 2 other children. MOB states history of depression noted in her chart was entered in error.   However, MOB was appreciative of social work services offered and was accepting of self-evaluation New Mom Checklist from Postpartum Progress resource to monitor mood postpartum.   Consult screened out.  Please contact the Clinical Social Worker if needs arise, by [facility] request, or if MOB scores greater than 9/yes to question 10 on [place] Postpartum Depression Screen.  CSW identifies no further need for intervention and no barriers to discharge at this time.  Signed,  Berniece Salines, MSW, LCSWA, [place] 07/31/2022 3:50 PM

## 2022-07-31 NOTE — Discharge Summary (Signed)
SVD OB Discharge Summary       Patient Name: Angie Wilcox DOB: 1981/03/28 MRN: [id number]  Date of admission: 07/30/2022 Delivering MD: Julianne Handler  Date of delivery: 07/30/2022 Type of delivery: SVD  Newborn Data: Sex: Baby female Circumcision: circ in pt prior to discharge today  Live born female  Birth Weight: 8 lb 11.3 oz (3950 g) APGAR: 72, 10  Newborn Delivery   Birth date/time: 07/30/2022 18:28:00 Delivery type: Vaginal, Spontaneous      Feeding: breast Infant being discharge to home with mother in stable condition.   Admitting diagnosis: Normal labor [O80, Z37.9] Intrauterine pregnancy: [redacted]w[redacted]d     Secondary diagnosis:  Principal Problem:   Normal labor Active Problems:   Acute blood loss anemia   SVD (spontaneous vaginal delivery)   Normal postpartum course                                Complications: None                                                              Intrapartum Procedures: spontaneous vaginal delivery Postpartum Procedures: none Complications-Operative and Postpartum:  1st degree perineal laceration Augmentation: N/A   History of Present Illness: Ms. Angie Wilcox is a 42 y.o. female, [phone], who presents at [redacted]w[redacted]d weeks gestation. 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/29/2020   Morbid obesity ([place]) 01/07/2020   Mild intermittent asthma 12/31/2019   History of gestational diabetes 10/23/2017     Active Ambulatory Problems    Diagnosis Date Noted   S/P biliopancreatic  diversion with duodenal switch 05/29/2020   Morbid obesity ([place]) 01/07/2020   Mild intermittent asthma 12/31/2019   History of gestational diabetes 10/23/2017   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 yo [id number] at [redacted]w[redacted]d was admitted in Active Labor on 07/30/2022. Labor course was complicated by none  Membrane Rupture Time/Date: 3:30 PM ,07/30/2022   Delivery Method:Vaginal, Spontaneous  Episiotomy: None  Lacerations:  1st degree;Perineal  Patient had a postpartum course complicated by anemia, but unable to take PO iron r/t h/o gastric surgery.  She is ambulating, tolerating a regular diet, passing flatus, and urinating well. Patient is discharged home in stable condition on 07/31/22.  Newborn Data: Birth date:07/30/2022  Birth time:6:28 PM  Gender:Female  Living status:Living  Apgars:10 ,10  Weight:3950  g  Postpartum Day # 1 : S/P NSVD due to pt was admitted on 3/16 progress to svd on 3/16 with SROM, gbs-, at 37.6 weeks, h/o anemia and bariatric surgery, recommended cooking in iron skillet, hsv+ no lesion, had svd on 3/16 over 1st degree laceration, ebl 164mls, hgb 11.3-9.7, asymptomatic. Had baby female and desires in pt circ. Patient up ad lib, denies syncope or dizziness. Reports consuming regular diet without issues and denies N/V. Patient reports 0 bowel movement + passing flatus.  Denies issues with urination and reports bleeding is "lighter."  Patient is breastfeeding and reports going well.  Desires undecided for postpartum contraception.  Pain is being appropriately managed with use of po meds.   Physical exam   Vitals:   07/30/22 2218 07/31/22 0156 07/31/22 0615 07/31/22 0744  BP: 117/80 116/78 (!) 123/91 119/83  Pulse: 74   66  Resp: 16 16    Temp: 98.4 F (36.9 C) 98.4 F (36.9 C) 97.8 F (36.6 C)   TempSrc: Oral Oral    SpO2: 98% 98% 98% 97%  Weight:      Height:       General: alert, cooperative, and no distress Lochia: appropriate Uterine Fundus: firm Perineum: 1st degree DVT Evaluation: No evidence of DVT seen on physical exam. Negative Homan's sign. No cords or calf tenderness. No significant calf/ankle edema.  Labs: Lab Results  Component Value Date   WBC 9.0 07/31/2022   HGB 9.7 (L) 07/31/2022   HCT 28.6 (L) 07/31/2022   MCV 84.9 07/31/2022   PLT 206 07/31/2022      Latest Ref Rng & Units 07/07/2022    5:51 PM  CMP  Glucose 70 - 99 mg/dL 73   BUN 6 - 20 mg/dL 5   Creatinine 0.44 - 1.00 mg/dL 0.58   Sodium 135 - 145 mmol/L 137   Potassium 3.5 - 5.1 mmol/L 2.9   Chloride 98 - 111 mmol/L 103   CO2 22 - 32 mmol/L 24   Calcium 8.9 - 10.3 mg/dL 8.4   Total Protein 6.5 - 8.1 g/dL 6.0   Total Bilirubin 0.3 - 1.2 mg/dL 0.7   Alkaline Phos 38 - 126 U/L 84   AST 15 - 41 U/L 44   ALT 0 - 44 U/L 40     Date of discharge: 07/31/2022 Discharge Diagnoses: Term Pregnancy-delivered Discharge instruction: per After Visit Summary and "Baby and Me Booklet".  After visit meds:   Activity:           unrestricted and pelvic rest Advance as tolerated. Pelvic rest for 6 weeks.  Diet:                routine Medications: PNV Postpartum contraception: Undecided Condition:  Pt discharge to home with baby in stable condition  Meds: Allergies as of 07/31/2022   No Known Allergies      Medication List     TAKE these medications    methocarbamol 500 MG tablet Commonly known as: ROBAXIN Take 500 mg by mouth 4 (four) times daily.   multivitamin tablet Take 1 tablet by mouth daily. Bariatric vitamin   potassium chloride SA 20 MEQ tablet Commonly known as: KLOR-CON  M Take 1 tablet (20 mEq total) by mouth daily for 5 days.   valACYclovir 500 MG tablet Commonly known as: VALTREX Take 500 mg by mouth 2 (two) times daily as needed.        Discharge Follow Up:   Follow-up Information  [facility]. Schedule an appointment as soon as possible for a visit in 6 week(s).   Specialty: Obstetrics and Gynecology Contact information: [address] [phone] [place], [place], PMHNP-BC  [address]  [place], [place] [address]  Cell: [phone]  Office Phone: [phone] Fax: [phone] 07/31/2022  7:53 AM

## 2022-07-31 NOTE — Lactation Note (Signed)
This note was copied from a baby's chart. Lactation Consultation Note  Patient Name: Boy Vincy Tarvin [id number] Date: 07/31/2022 Age:43 hours Reason for consult: Initial assessment;Early term 37-38.6wks;Breastfeeding assistance;Infant weight loss;Maternal endocrine disorder (2.28% WL)  Infant was at 34 hours old when [employer] entered the room.  P3 birth parent stated that things were going well with breastfeeding.  The birth parent is an experienced breastfeeding parent.  The birth parent has had a gastric surgery and is taking her prescribed vitamins.  Marne spoke with the birth parent about:  Pumping when she returns home Cluster feeding  Infant behavior on day 1 Hand expression  Pace bottle feeding   All of the birth parent's questions were answered.   Infant Feeding Plan:  Breastfeed 8+ times in 24 hours according to feeding cues.  Hand express and feed expressed milk to the infant via a spoon.  Call [place] [facility] for assistance with breastfeeding.    Maternal Data Has patient been taught Hand Expression?: Yes Does the patient have breastfeeding experience prior to this delivery?: Yes How long did the patient breastfeed?: 5 weeks with oldest (43 yrs old) & 2.5 years with her now 43 year old  Feeding Mother's Current Feeding Choice: Breast Milk and Formula  LATCH Score Latch: Grasps breast easily, tongue down, lips flanged, rhythmical sucking.  Audible Swallowing: A few with stimulation  Type of Nipple: Everted at rest and after stimulation  Comfort (Breast/Nipple): Soft / non-tender  Hold (Positioning): No assistance needed to correctly position infant at breast.  LATCH Score: 9   Lactation Tools Discussed/Used    Interventions Interventions: Breast feeding basics reviewed;Education;LC Services brochure  Discharge Pump: DEBP;Personal  Consult Status Consult Status: Follow-up Date: 08/01/22 Follow-up type: In-patient    Lysbeth Penner 07/31/2022, 12:28 PM

## 2022-08-03 ENCOUNTER — Ambulatory Visit: Payer: BC Managed Care – PPO

## 2022-08-04 ENCOUNTER — Other Ambulatory Visit: Payer: Self-pay | Admitting: Hematology and Oncology

## 2022-08-04 ENCOUNTER — Inpatient Hospital Stay: Payer: BC Managed Care – PPO | Attending: Hematology and Oncology

## 2022-08-04 DIAGNOSIS — D500 Iron deficiency anemia secondary to blood loss (chronic): Secondary | ICD-10-CM

## 2022-08-07 ENCOUNTER — Inpatient Hospital Stay (HOSPITAL_COMMUNITY): Payer: BC Managed Care – PPO

## 2022-08-07 ENCOUNTER — Inpatient Hospital Stay (HOSPITAL_COMMUNITY)
Admission: RE | Admit: 2022-08-07 | Payer: BC Managed Care – PPO | Source: Home / Self Care | Admitting: Obstetrics and Gynecology

## 2022-08-08 ENCOUNTER — Other Ambulatory Visit: Payer: BC Managed Care – PPO

## 2022-08-08 ENCOUNTER — Ambulatory Visit: Payer: BC Managed Care – PPO

## 2022-08-11 ENCOUNTER — Telehealth (HOSPITAL_COMMUNITY): Payer: Self-pay | Admitting: *Deleted

## 2022-08-11 ENCOUNTER — Inpatient Hospital Stay: Payer: BC Managed Care – PPO | Admitting: Hematology and Oncology

## 2022-08-11 NOTE — Progress Notes (Deleted)
[facility] [phone]   [phone]  PROGRESS NOTE  Patient Care Team: Crawford Givens, MD as PCP - General (Obstetrics and Gynecology) Waymon Amato, MD as PCP - OBGYN (Obstetrics and Gynecology) Waymon Amato, MD as Consulting Physician (Obstetrics and Gynecology)  Hematological/Oncological History # ***  Interval History:  Angie Wilcox 42 y.o. female with medical history significant for *** presents for a follow up visit. The patient's last visit was on ***. In the interim since the last visit ***  MEDICAL HISTORY:  Past Medical History:  Diagnosis Date   Allergy    Herpes     SURGICAL HISTORY: Past Surgical History:  Procedure Laterality Date   BARIATRIC SURGERY  02/10/2020   Sadi -S    SOCIAL HISTORY: Social History   Socioeconomic History   Marital status: Married    Spouse name: Elaya Droege   Number of children: 1   Years of education: Not on file   Highest education level: Not on file  Occupational History   Occupation: Chaplain  Tobacco Use   Smoking status: Never   Smokeless tobacco: Never  Vaping Use   Vaping Use: Never used  Substance and Sexual Activity   Alcohol use: Not Currently   Drug use: Never   Sexual activity: Not Currently  Other Topics Concern   Not on file  Social History Narrative   Not on file   Social Determinants of Health   Financial Resource Strain: Not on file  Food Insecurity: No Food Insecurity (07/30/2022)   Hunger Vital Sign    Worried About Running Out of Food in the Last Year: Never true    Ran Out of Food in the Last Year: Never true  Transportation Needs: No Transportation Needs (07/30/2022)   PRAPARE - Hydrologist (Medical): No    Lack of Transportation (Non-Medical): No  Physical Activity: Not on file  Stress: Not on file  Social Connections: Not on file  Intimate Partner Violence: Not At Risk (07/30/2022)   Humiliation, Afraid, Rape, and Kick  questionnaire    Fear of Current or Ex-Partner: No    Emotionally Abused: No    Physically Abused: No    Sexually Abused: No    FAMILY HISTORY: Family History  Problem Relation Age of Onset   Hypertension Mother    Cancer Paternal Aunt        thyroid   Cancer Maternal Grandmother    Diabetes Paternal Grandmother     ALLERGIES:  has No Known Allergies.  MEDICATIONS:  Current Outpatient Medications  Medication Sig Dispense Refill   methocarbamol (ROBAXIN) 500 MG tablet Take 500 mg by mouth 4 (four) times daily.     Multiple Vitamin (MULTIVITAMIN) tablet Take 1 tablet by mouth daily. Bariatric vitamin     potassium chloride SA (KLOR-CON M) 20 MEQ tablet Take 1 tablet (20 mEq total) by mouth daily for 5 days. 5 tablet 0   valACYclovir (VALTREX) 500 MG tablet Take 500 mg by mouth 2 (two) times daily as needed.     No current facility-administered medications for this visit.    REVIEW OF SYSTEMS:   Constitutional: ( - ) fevers, ( - )  chills , ( - ) night sweats Eyes: ( - ) blurriness of vision, ( - ) double vision, ( - ) watery eyes Ears, nose, mouth, throat, and face: ( - ) mucositis, ( - ) sore throat Respiratory: ( - ) cough, ( - ) dyspnea, ( - )  wheezes Cardiovascular: ( - ) palpitation, ( - ) chest discomfort, ( - ) lower extremity swelling Gastrointestinal:  ( - ) nausea, ( - ) heartburn, ( - ) change in bowel habits Skin: ( - ) abnormal skin rashes Lymphatics: ( - ) new lymphadenopathy, ( - ) easy bruising Neurological: ( - ) numbness, ( - ) tingling, ( - ) new weaknesses Behavioral/Psych: ( - ) mood change, ( - ) new changes  All other systems were reviewed with the patient and are negative.  PHYSICAL EXAMINATION: ECOG PERFORMANCE STATUS: {CHL ONC ECOG PS:[phone]}  There were no vitals filed for this visit. There were no vitals filed for this visit.  GENERAL: alert, no distress and comfortable SKIN: skin color, texture, turgor are normal, no rashes or  significant lesions EYES: conjunctiva are pink and non-injected, sclera clear OROPHARYNX: no exudate, no erythema; lips, buccal mucosa, and tongue normal  NECK: supple, non-tender LYMPH:  no palpable lymphadenopathy in the cervical, axillary or inguinal LUNGS: clear to auscultation and percussion with normal breathing effort HEART: regular rate & rhythm and no murmurs and no lower extremity edema ABDOMEN: soft, non-tender, non-distended, normal bowel sounds Musculoskeletal: no cyanosis of digits and no clubbing  PSYCH: alert & oriented x 3, fluent speech NEURO: no focal motor/sensory deficits  LABORATORY DATA:  I have reviewed the data as listed    Latest Ref Rng & Units 07/31/2022    5:05 AM 07/30/2022    2:44 PM 07/07/2022    5:51 PM  CBC  WBC 4.0 - 10.5 K/uL 9.0  4.9  5.5   Hemoglobin 12.0 - 15.0 g/dL 9.7  11.3  9.7   Hematocrit 36.0 - 46.0 % 28.6  33.7  29.5   Platelets 150 - 400 K/uL 206  243  226        Latest Ref Rng & Units 07/07/2022    5:51 PM 06/17/2022   10:14 AM 06/14/2021    8:04 AM  CMP  Glucose 70 - 99 mg/dL 73  83  86   BUN 6 - 20 mg/dL 5  9  17    Creatinine 0.44 - 1.00 mg/dL 0.58  0.49  0.70   Sodium 135 - 145 mmol/L 137  140  140   Potassium 3.5 - 5.1 mmol/L 2.9  3.6  4.1   Chloride 98 - 111 mmol/L 103  107  106   CO2 22 - 32 mmol/L 24  27  26    Calcium 8.9 - 10.3 mg/dL 8.4  8.4  8.9   Total Protein 6.5 - 8.1 g/dL 6.0  6.1  7.2   Total Bilirubin 0.3 - 1.2 mg/dL 0.7  0.3  0.3   Alkaline Phos 38 - 126 U/L 84  86  90   AST 15 - 41 U/L 44  48  21   ALT 0 - 44 U/L 40  38  22     No results found for: "MPROTEIN" No results found for: "KPAFRELGTCHN", "LAMBDASER", "KAPLAMBRATIO"   BLOOD FILM: *** Review of the peripheral blood smear showed normal appearing white cells with neutrophils that were appropriately lobated and granulated. There was no predominance of bi-lobed or hyper-segmented neutrophils appreciated. No Dohle bodies were noted. There was no left  shifting, immature forms or blasts noted. Lymphocytes remain normal in size without any predominance of large granular lymphocytes. Red cells show no anisopoikilocytosis, macrocytes , microcytes or polychromasia. There were no schistocytes, target cells, echinocytes, acanthocytes, dacrocytes, or stomatocytes.There was no rouleaux formation,  nucleated red cells, or intra-cellular inclusions noted. The platelets are normal in size, shape, and color without any clumping evident.  RADIOGRAPHIC STUDIES: I have personally reviewed the radiological images as listed and agreed with the findings in the report. [place] MFM FETAL BPP WO NON STRESS  Result Date: 07/18/2022 ----------------------------------------------------------------------  OBSTETRICS REPORT                       (Signed Final 07/18/2022 08:23 am) ---------------------------------------------------------------------- Patient Info  ID #:       [id number]                          D.O.B.:  1981/02/27 (41 yrs)  Name:       Angie Wilcox               Visit Date: 07/18/2022 07:25 am ---------------------------------------------------------------------- Performed By  Attending:        Tama High MD        Ref. Address:     [facility]                                                             Gyne[facility]                                                             [place], [place]  [place]  Performed By:     Stephenie Acres        Location:         [facility]                                  [facility]                                                             [place]                                                              [facility]  Referred By:      Donnel Saxon                    CNM ---------------------------------------------------------------------- Orders  #  Description                           Code        Ordered By  1  [place] MFM FETAL BPP WO NON               76819.01    YU FANG     STRESS ----------------------------------------------------------------------  #  Order #                     Accession #                Episode #  1  [id number]                   [id number]                 [id number] ---------------------------------------------------------------------- Indications  Advanced maternal age multigravida 70+,        O42.523  third trimester  Pregnancy complicated by previous bariatric    O99.843  surgery, antepartum, third trimester  Poor obstetric history-Recurrent (habitual)    O26.20  abortion  Poor obstetric history: Previous gestational   O09.299  diabetes requiring metformin, insulin  Obesity complicating pregnancy, third          O99.213  trimester (BMI 30)  NIPS LR female, [facility] negative  [redacted] weeks gestation of pregnancy                Z3A.36 ---------------------------------------------------------------------- Fetal Evaluation  Num Of Fetuses:         1  Fetal Heart Rate(bpm):  144  Cardiac Activity:       Observed  Presentation:           Cephalic  Placenta:               Posterior  P. Cord Insertion:      Previously visualized  Amniotic Fluid  AFI FV:      Subjectively upper-normal  AFI Sum(cm)     %  Tile       Largest Pocket(cm)  24.56           94          8.7  RUQ(cm)       RLQ(cm)       LUQ(cm)        LLQ(cm)  4.67          8.7           5.08           6.11 ---------------------------------------------------------------------- Biophysical Evaluation  Amniotic F.V:   Pocket => 2 cm             F. Tone:        Observed  F. Movement:    Observed                   Score:          8/8  F. Breathing:   Observed  ---------------------------------------------------------------------- OB History  Gravidity:    7         Term:   2         SAB:   3  TOP:          1        Living:  2 ---------------------------------------------------------------------- Gestational Age  LMP:           22w 6d        Date:  07/20/21                   EDD:   04/26/22  Best:          36w 1d     Det. ByLoman Chroman         EDD:   08/14/22                                      (01/21/22) ---------------------------------------------------------------------- Anatomy  Cranium:               Appears normal         Aortic Arch:            Previously seen  Cavum:                 Previously             Ductal Arch:            Previously seen                         visualized  Ventricles:            Previously seen        Diaphragm:              Appears normal  Choroid Plexus:        Previously seen        Stomach:                Appears normal, left  sided  Cerebellum:            Previously seen        Abdomen:                Appears normal  Posterior Fossa:       Previously seen        Abdominal Wall:         Previously seen  Nuchal Fold:           Previously seen        Cord Vessels:           Appears normal (3                                                                        vessel cord)  Face:                  Orbits and profile     Kidneys:                Appear normal                         previously seen  Lips:                  Previously seen        Bladder:                Appears normal  Thoracic:              Appears normal         Spine:                  Previously seen  Heart:                 Previously seen        Upper Extremities:      Previously seen  RVOT:                  Previously seen        Lower Extremities:      Previously seen  LVOT:                  Previously seen  Other:  Anatomy prev seen  ---------------------------------------------------------------------- Impression  Advanced maternal age (52 years at delivery).  Amniotic fluid is in the upper limit of normal.  Good fetal  activity seen.  Antenatal testing is reassuring.  BPP 8/8.  Cephalic presentation. ---------------------------------------------------------------------- Recommendations  -NST next week.  -Fetal growth assessment and BPP in 2 weeks.  -Recommend delivery at 48 weeks' gestation. ----------------------------------------------------------------------                 Tama High, MD Electronically Signed Final Report   07/18/2022 08:23 am ----------------------------------------------------------------------   ASSESSMENT & PLAN ***  No orders of the defined types were placed in this encounter.   All questions were answered. The patient knows to call the clinic with any problems, questions or concerns.  A total of more than 30 minutes were spent on this encounter with face-to-face time and non-face-to-face time, including preparing to see the patient, ordering tests and/or medications, counseling the  patient and coordination of care as outlined above.   Ledell Peoples, MD Department of Hematology/Oncology [place] [facility] Phone: [phone] Pager: [phone] Email: [email or web address]  08/11/2022 7:27 AM

## 2022-08-11 NOTE — Telephone Encounter (Signed)
Hospital Discharge Follow-Up Call:  Patient reports that she is well and has no concerns about her healing process.  EPDS today was 1 and she endorses this accurately reflects that she is doing well emotionally.  Patient says that baby is well and she has no concerns about baby's health.  Reviewed ABCs of Safe Sleep.

## 2022-09-05 DIAGNOSIS — F53 Postpartum depression: Secondary | ICD-10-CM

## 2022-09-05 HISTORY — DX: Postpartum depression: F53.0

## 2022-11-16 ENCOUNTER — Encounter: Payer: Self-pay | Admitting: Family Medicine

## 2022-11-16 ENCOUNTER — Ambulatory Visit (INDEPENDENT_AMBULATORY_CARE_PROVIDER_SITE_OTHER): Payer: BC Managed Care – PPO | Admitting: Family Medicine

## 2022-11-16 ENCOUNTER — Telehealth: Payer: Self-pay | Admitting: Obstetrics and Gynecology

## 2022-11-16 VITALS — BP 116/70 | HR 89 | Temp 96.9°F | Ht 66.5 in | Wt 183.2 lb

## 2022-11-16 DIAGNOSIS — K439 Ventral hernia without obstruction or gangrene: Secondary | ICD-10-CM | POA: Insufficient documentation

## 2022-11-16 NOTE — Progress Notes (Signed)
[facility] [address] [place] [address] Dept: [phone] Dept Fax: [phone]  Office Visit  Subjective:    Patient ID: Angie Wilcox, female    DOB: 07-29-1980, 42 y.o..   MRN: [id number]  Chief Complaint  Patient presents with   Abdominal Pain    C/o having upper abdominal pain x [redacted] weeks along with constipation.  Lifted a case of water 1-2 days ago.  Has taken stool/laxatives.     History of Present Illness:  Patient is in today complaining of some mid abdominal discomfort for some weeks. She thought this might relate to constipation. However, last night, she lifted a large flat of water and a large watermelon a the grocery store. she had acute onset of mid-abdominal pain. She noted a firm area that bulged out above the umbilicus. when she arrived home, she lay down. This subsided overnight. Angie Wilcox is 3 months postpartum with her second vaginal delivery. She had a prior laparoscopic procedure for a duodenal switch and gastric sleeve.  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 Bariatric  vitamin     valACYclovir (VALTREX) 500 MG tablet Take 500 mg by mouth 2 (two) times daily as needed.     methocarbamol (ROBAXIN) 500 MG tablet Take 500 mg by mouth 4 (four) times daily. (Patient not taking: Reported on 11/16/2022)     potassium chloride SA (KLOR-CON M) 20 MEQ tablet Take 1 tablet (20 mEq total) by mouth daily for 5 days. 5 tablet 0   No facility-administered medications prior to visit.   No Known Allergies   Objective:   Today's Vitals   11/16/22 1405  BP: 116/70  Pulse: 89  Temp: (!) 96.9 F (36.1 C)  TempSrc: Temporal  SpO2: 97%  Weight: 183 lb 3.2 oz (83.1 kg)  Height: 5' 6.5" (1.689 m)   Body mass index is 29.13 kg/m.   General: Well developed, well nourished. No acute distress. Abdomen: The indicated area of the bulging is superior to the umbilicus, just left of the midline. There is a deep raphe between   the rectus abdominis muscles in this area and she has pain with palpation. There is a well healed scar near this site form her  prior laparoscopic surgery. Psych: Alert and oriented. Normal mood and affect.  Health Maintenance Due  Topic Date Due   FOOT EXAM  Never done   OPHTHALMOLOGY EXAM  Never done   PAP SMEAR-Modifier  Never done   COVID-19 Vaccine (3 - Pfizer risk series) 01/30/2020   HEMOGLOBIN A1C  12/12/2021   Diabetic kidney evaluation - Urine  ACR  06/14/2022     Assessment & Plan:   Problem List Items Addressed This Visit       Other   Ventral hernia without obstruction or gangrene - Primary    History and exam consistent with a potential incisional hernia vs. ventral hernia. I will refer for surgical evaluation. Avoid lifting or straining. Follow-up if swelling and pain worsen or will not resolve.      Relevant Orders   Ambulatory referral to General Surgery    Return if symptoms worsen or fail to improve.   Loyola Mast, MD

## 2022-11-16 NOTE — Patient Instructions (Signed)

## 2022-11-16 NOTE — Telephone Encounter (Signed)
FYI: This call has been transferred to triage nurse: the Triage Nurse. Once the result note has been entered staff can address the message at that time.  Pt scheduled herself an appt for Monday 11/21/22 through mychart for "I am having strong abdominal pain". I called her and transferred her over to nurse triage.     Please advise at [facility]   [phone]    Message is routed to Provider Pool.

## 2022-11-16 NOTE — Assessment & Plan Note (Signed)
History and exam consistent with a potential incisional hernia vs. ventral hernia. I will refer for surgical evaluation. Avoid lifting or straining. Follow-up if swelling and pain worsen or will not resolve.

## 2022-11-16 NOTE — Telephone Encounter (Signed)
NT recommends pt be seen in clinic within 4 hrs.   PT reports having abdominal pain for a couple of weeks. Pt lifted a case of water and watermelon last night and noticed a "bulge" above her umbilicus. Pt is 3 months postpartum vaginal delivery. Pt has not contacted OB at this time, expresses difficulty getting appointments and long wait times with the OB office. Explained that Dr. Veto Kemps agrees to see her in clinic, however does not mean she will not need to be seen by OB.   Pt scheduled with Dr. Veto Kemps today at 2:00pm.

## 2022-11-21 ENCOUNTER — Ambulatory Visit: Payer: BC Managed Care – PPO | Admitting: Family Medicine

## 2022-11-24 ENCOUNTER — Encounter: Payer: Self-pay | Admitting: Family Medicine

## 2022-11-24 ENCOUNTER — Telehealth: Payer: Self-pay | Admitting: Family Medicine

## 2022-11-24 NOTE — Telephone Encounter (Signed)
Noted. Dm/cma  

## 2022-11-24 NOTE — Telephone Encounter (Signed)
Pt called back and said that the pharmacy is going to refill medications because she do have refills

## 2022-12-09 ENCOUNTER — Telehealth: Payer: Self-pay | Admitting: Family Medicine

## 2022-12-09 NOTE — Telephone Encounter (Signed)
error 

## 2022-12-26 IMAGING — MG MM BREAST LOCALIZATION CLIP
4 series · 4 of 12 positions shown · non-contrast
Comparison: Previous exam(s).

CLINICAL DATA: Evaluate biopsy marker

EXAM:
3D DIAGNOSTIC RIGHT MAMMOGRAM POST ULTRASOUND BIOPSY

[R ML synth-2D]
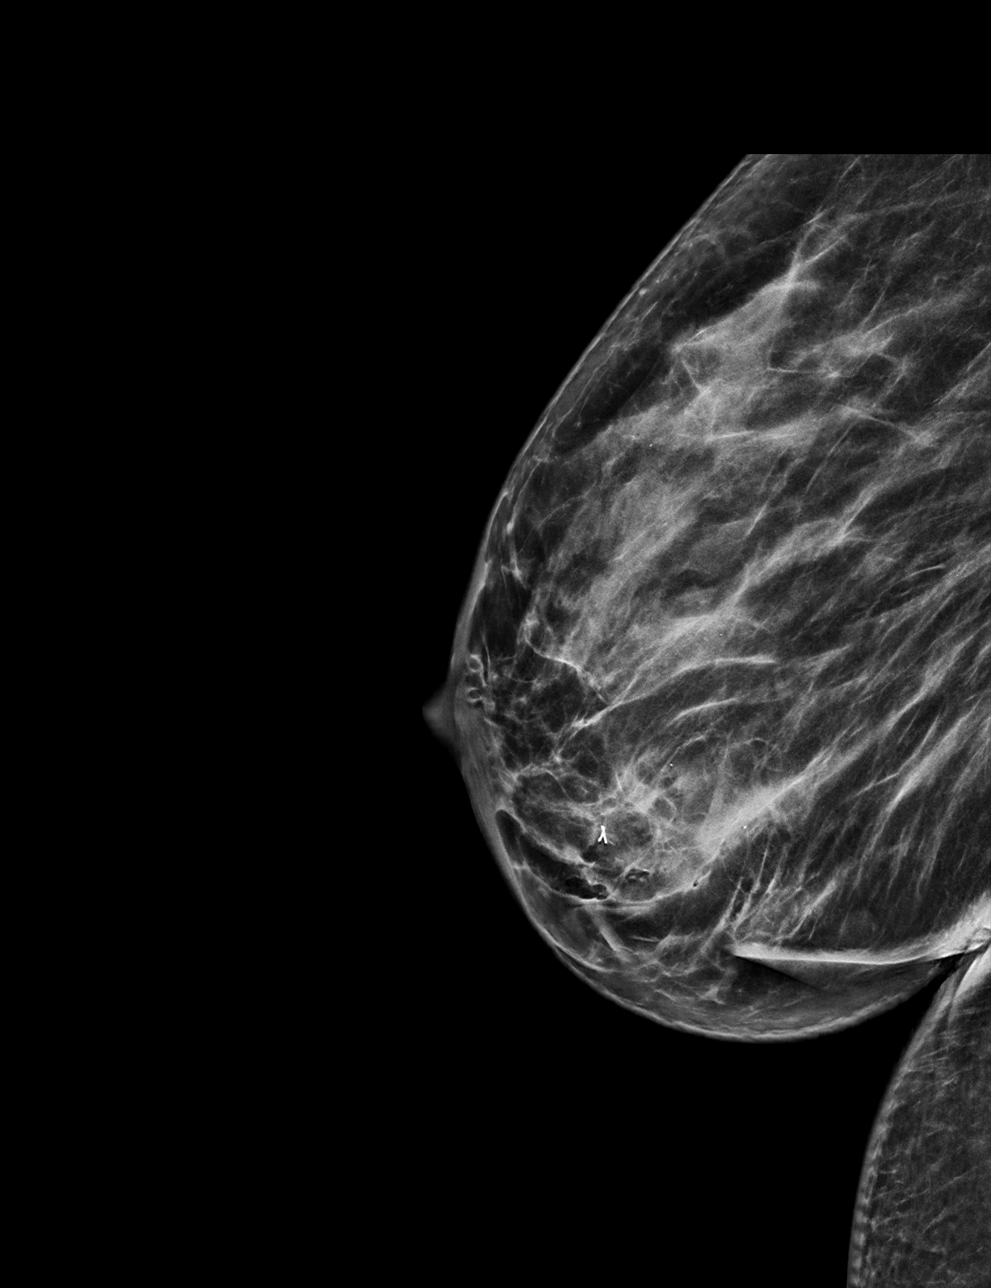

[R CC synth-2D]
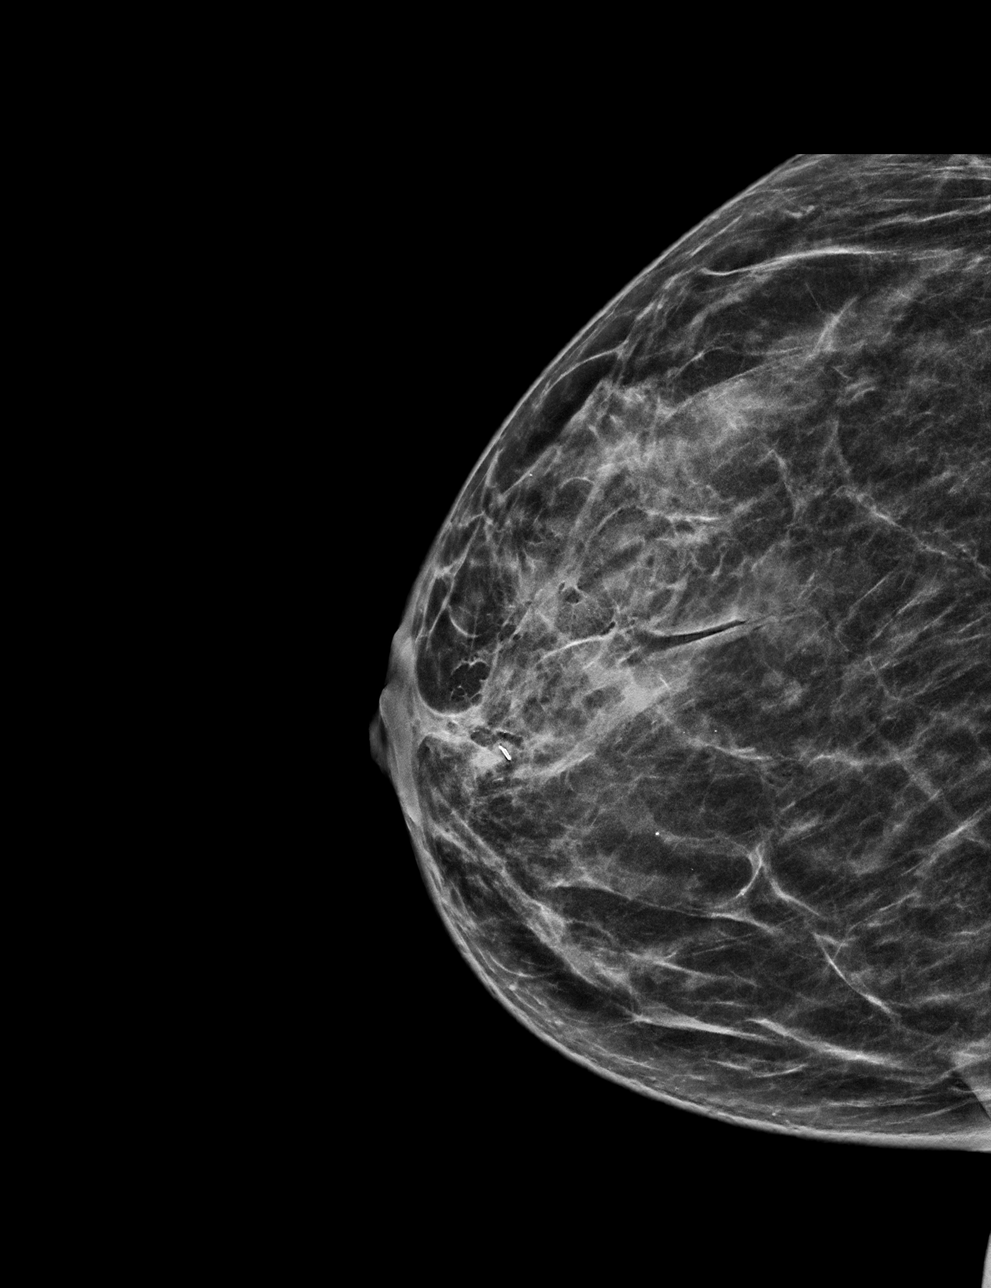

[R ML tomo · tomo slice 31/62.0]
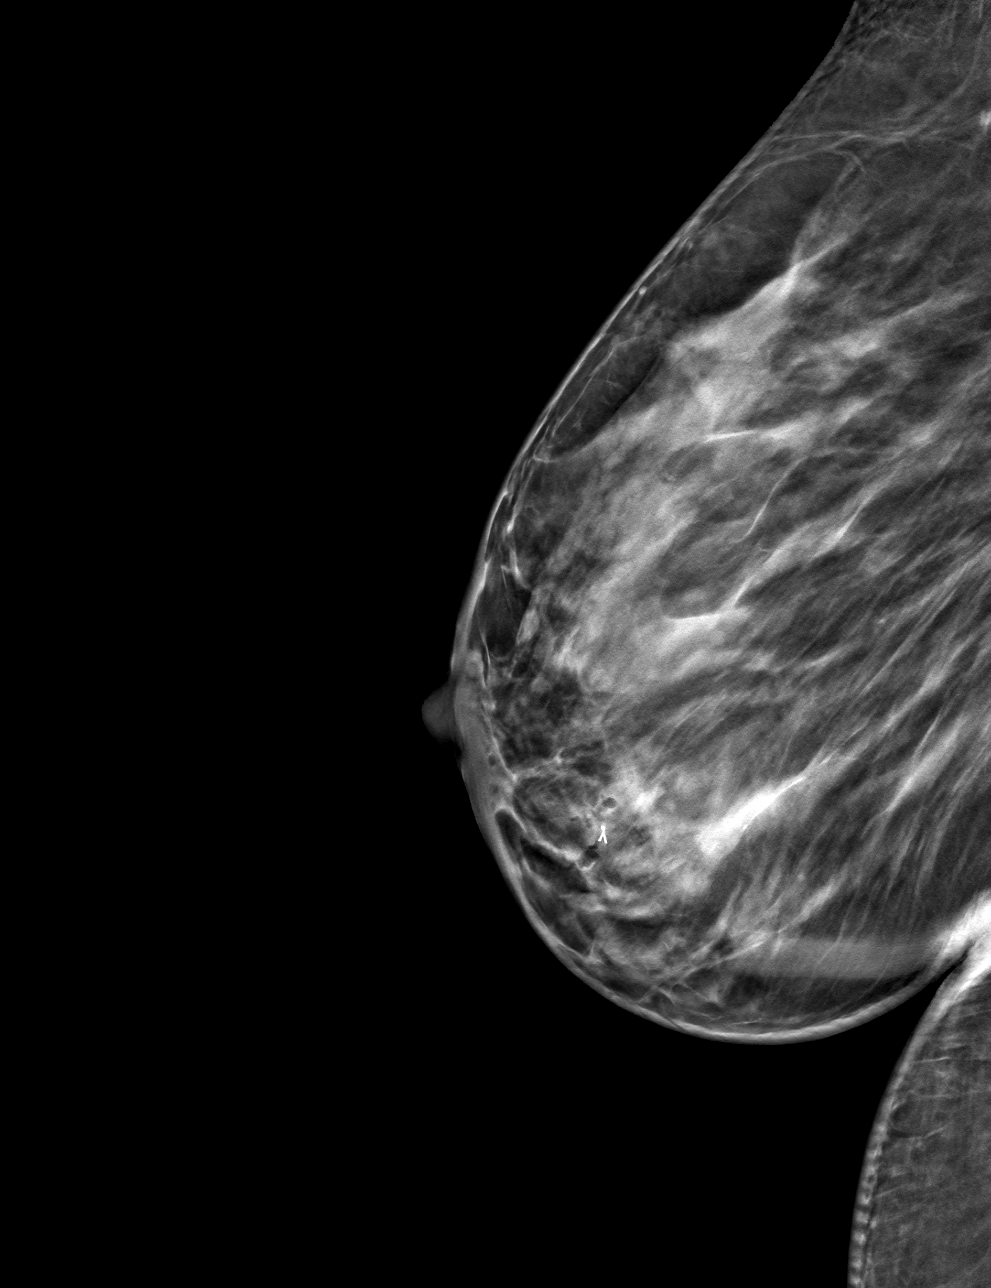

[R CC tomo · tomo slice 27/54.0]
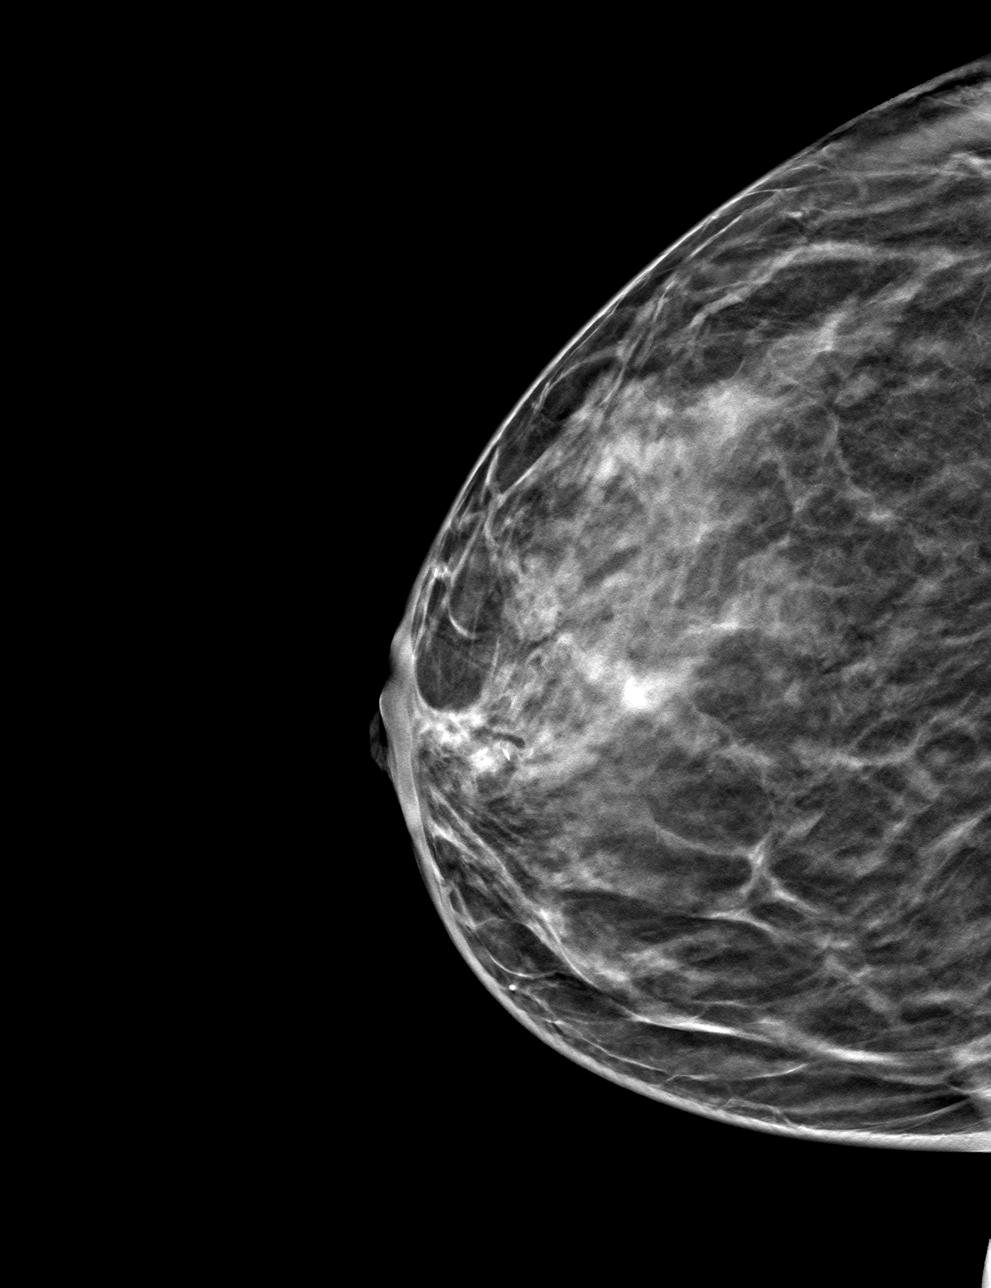

[4 of 12 positions shown; findings below may reference images not displayed]

FINDINGS: 3D Mammographic images were obtained following ultrasound guided
biopsy of a right breast mass. The biopsy marking clip is in
expected position at the site of biopsy.
IMPRESSION: Appropriate positioning of the ribbon shaped biopsy marking clip at
the site of biopsy in the location of the biopsied right breast
mass.

Final Assessment: Post Procedure Mammograms for Marker Placement

## 2023-08-16 ENCOUNTER — Encounter: Payer: Self-pay | Admitting: Family Medicine

## 2023-09-14 ENCOUNTER — Telehealth: Admitting: Family Medicine

## 2023-09-14 DIAGNOSIS — A6000 Herpesviral infection of urogenital system, unspecified: Secondary | ICD-10-CM | POA: Diagnosis not present

## 2023-09-15 MED ORDER — VALACYCLOVIR HCL 500 MG PO TABS: 500.0000 mg | ORAL_TABLET | Freq: Two times a day (BID) | ORAL | 0 refills | Status: AC

## 2023-09-15 NOTE — Progress Notes (Signed)
 E-Visit for Herpes Simplex  We are sorry that you are not feeling well.  Here is how we plan to help!  Based on what you have shared ith me, it looks like you may be having an outbreak/flare-up of genital herpes.    I have prescribed I have prescribed Valacyclovir 500 mg Take one by mouth twice a day for 3 days.    If you have been prescribed long term medications to be taken on a regular basis, it is important to follow the recommendations and take them as ordered.    Outbreaks usually include blisters and open sores in the genital area. Outbreaks that happen after the first time are usually not as severe and do not last as long. Genital Herpes Simplex is a commonly sexually transmitted viral infection that is found worldwide. Most of these genital infections are caused by one or two herpes simplex viruses that is passed from person to person during vaginal, oral, or anal sex. Sometimes, people do not know they have herpes because they do not have any symptoms.  Please be aware that if you have genital herpes you can be contagious even when you are not having rash or flare-up and you may not have any symptoms, even when you are taking suppressive medicines.  Herpes cannot be cured. The disease usually causes most problems during the first few years. After that, the virus is still there, but it causes few to no symptoms. Even when the virus is active, people with herpes can take medicines to reduce and help prevent symptoms.  Herpes is an infection that can cause blisters and open sores on the genital area. Herpes is caused by a virus that is passed from person to person during vaginal, oral, or anal sex. Sometimes, people do not know they have herpes because they do not have any symptoms. Herpes cannot be cured. The disease usually causes most problems during the first few years. After that, the virus is still there, but it causes few to no symptoms. Even when the virus is active, people with herpes  can take medicines to reduce and help prevent symptoms.  If you have been prescribed medications to be taken on a regular basis, it is important to follow the recommendations and take them as ordered.  Some people with herpes never have any symptoms. But other people can develop symptoms within a few weeks of being infected with the herpes virus   Symptoms usually include blisters in the genital area. In women, this area includes the vagina, buttocks, anus, or thighs. In men, this area includes the penis, scrotum, anus, butt, or thighs. The blisters can become painful open sores, which then crust over as they heal. Sometimes, people can have other symptoms that include:  ?Blisters on the mouth or lips ?Fever, headache, or pain in the joints ?Trouble urinating  Outbreaks might occur every month or more often, or just once or twice a year. Sometimes, people can tell when an outbreak will occur, because they feel itching or pain beforehand. Sometimes they do not know that an outbreak is coming because they have no symptoms. Whatever your pattern is, keep in mind that herpes outbreaks usually become less frequent over time as you get older. Certain things, called "triggers," can make outbreaks more likely to occur. These include stress, sunlight, menstrual periods,or getting sick.  Antiviral therapy can shorten the duration of symptoms and signs in primary infection, which, when untreated, can be associated with significant increase in the  symptoms of the disease.  HOME CARE Use a portable bath (such as a "Sitz bath") where you can sit in warm water for about 20 minutes. Your bathtub could also work. Avoid bubble baths.  Keep the genital area clean and dry and avoid tight clothes.  Take over-the-counter pain medicine such as acetaminophen (brand name: Tylenol) or ibuprofen sample brand names: Advil, Motrin). But avoid aspirin.  Only take medications as instructed by your medical team.  You are  most likely to spread herpes to a sex partner when you have blisters and open sores on your body. But it's also possible to spread herpes to your partner when you do not have any symptoms. That is because herpes can be present on your body without causing any symptoms, like blisters or pain.  Telling your sex partner that you have herpes can be hard. But it can help protect them, since there are ways to lower the risk of spreading the infection.   Using a condom every time you have sex  Not having sex when you have symptoms  Not having oral sex if you have blisters or open sores (in the genital area or around your mouth)  MAKE SURE YOU   Understand these instructions. Do not have sex without using a condom until you have been seen by a doctor and as instructed by the provider If you are not better or improved within 7 days, you MUST have a follow up at your doctor or the health department for evaluation. There are other causes of rashes in the genital region.  Thank you for choosing an e-visit.  Your e-visit answers were reviewed by a board certified advanced clinical practitioner to complete your personal care plan. Depending upon the condition, your plan could have included both over the counter or prescription medications.  Please review your pharmacy choice. Make sure the pharmacy is open so you can pick up prescription now. If there is a problem, you may contact your provider through [employer] and have the prescription routed to another pharmacy.  Your safety is important to [place]. If you have drug allergies check your prescription carefully.   For the next 24 hours you can use MyChart to ask questions about today's visit, request a non-urgent call back, or ask for a work or school excuse. You will get an email in the next two days asking about your experience. I hope that your e-visit has been valuable and will speed your recovery.   have provided 5 minutes of non face to face  time during this encounter for chart review and documentation.

## 2024-03-26 ENCOUNTER — Ambulatory Visit (INDEPENDENT_AMBULATORY_CARE_PROVIDER_SITE_OTHER): Admitting: Family Medicine

## 2024-03-26 ENCOUNTER — Encounter: Payer: Self-pay | Admitting: Family Medicine

## 2024-03-26 VITALS — BP 124/70 | HR 96 | Temp 98.1°F | Ht 66.5 in | Wt 208.0 lb

## 2024-03-26 DIAGNOSIS — M25542 Pain in joints of left hand: Secondary | ICD-10-CM | POA: Diagnosis not present

## 2024-03-26 MED ORDER — NAPROXEN 500 MG PO TABS: 500.0000 mg | ORAL_TABLET | Freq: Two times a day (BID) | ORAL | 0 refills | Status: AC

## 2024-03-26 NOTE — Progress Notes (Signed)
 [facility] [address] [place] [address] Dept: [phone] Dept Fax: [phone]  Office Visit  Subjective:    Patient ID: Angie Wilcox, female    DOB: [birth year], 43 y.o..   MRN: [id number]  Chief Complaint  Patient presents with   Hand Pain    C/o having pain in the LT middle finger on LT hand x 2 weeks.  Has taken Tylenol .    History of Present Illness:  Patient is in today complaining of pain in her left 3rd finger. Ms. Bagent noted this started in August, when she replaced a toilet seat. She found removing the old seat to be difficult, taking about an hour. Afterwards, she had some pain in the left 3rd finger. She thought she might have strained this, gripping the wrench. She noted the pain then seemed to worsen with time. She did try taking some Tylenol  and ibuprofen  periodically.  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   History of kidney stones 06/07/2021   Abnormal mammogram of right breast 06/07/2021   S/P biliopancreatic diversion with duodenal switch 05/29/2020   Mild intermittent asthma 12/31/2019   History of gestational diabetes 10/23/2017   Past Surgical History:  Procedure Laterality Date   BARIATRIC SURGERY  02/10/2020   [place] -S   Family History  Problem Relation Age of Onset   Hypertension Mother    Cancer Paternal Aunt        thyroid   Cancer Maternal Grandmother    Diabetes Paternal Grandmother    Outpatient Medications Prior to Visit  Medication Sig Dispense Refill   methocarbamol (ROBAXIN) 500 MG tablet Take 500 mg by mouth 4 (four) times daily. (Patient not taking: Reported on 11/16/2022)     Multiple Vitamin (MULTIVITAMIN) tablet Take 1  tablet by mouth daily. Bariatric vitamin     valACYclovir  (VALTREX ) 500 MG tablet Take 500 mg by mouth 2 (two) times daily as needed.     No facility-administered medications prior to visit.   No Known Allergies   Objective:   Today's Vitals   03/26/24 1616  BP: 124/70  Pulse: 96  Temp: 98.1 F (36.7 C)  TempSrc: Temporal  SpO2: 97%  Weight: 208 lb (94.3 kg)  Height: 5' 6.5 (1.689 m)   Body mass index is 33.07 kg/m.   General: Well developed, well nourished. No acute distress. Extremities: Full ROM of the left 3rd finger. No joint swelling. There is pain on palpation over the  PIP joint, esp. laterally and ventrally. No joint laxity and no swelling noted.  Psych: Alert and oriented. Normal mood and affect.  Health Maintenance Due  Topic Date Due   Pneumococcal Vaccine (1 of 2 - PCV) Never done   Hepatitis B Vaccines 19-59 Average Risk (1 of 3 - 19+ 3-dose series) Never done   HPV VACCINES (1 - Risk 3-dose SCDM series) Never done   Mammogram  07/03/2023   Influenza Vaccine  12/15/2023   COVID-19 Vaccine (4 - 2025-26 season) 01/15/2024     Assessment & Plan:   Problem List Items Addressed This Visit       Other   Left 3rd finger PIP joint pain - Primary   Appears to be a PIP joint sprain that has  lignered. Recommend using a popsicle stick and tape to make a splint. She should use this for 1 week. I will have her take naproxen 500 mg bid for 7 days. She should be safe to continue breastfeeding her toddler. If not improved after 1 week, will consider orthopedic referral.      Relevant Medications   naproxen (NAPROSYN) 500 MG tablet    Return in about 1 week (around 04/02/2024), or if symptoms worsen or fail to improve.    Garnette CHRISTELLA Simpler, MD  I,Emily Lagle,acting as a scribe for Garnette CHRISTELLA Simpler, MD.,have documented all relevant documentation on the behalf of Garnette CHRISTELLA Simpler, MD.  I, Garnette CHRISTELLA Simpler, MD, have reviewed all documentation for this visit. The documentation  on 03/26/2024 for the exam, diagnosis, procedures, and orders are all accurate and complete.

## 2024-03-26 NOTE — Assessment & Plan Note (Signed)
 Appears to be a PIP joint sprain that has lignered. Recommend using a popsicle stick and tape to make a splint. She should use this for 1 week. I will have her take naproxen 500 mg bid for 7 days. She should be safe to continue breastfeeding her toddler. If not improved after 1 week, will consider orthopedic referral.

## 2024-04-19 ENCOUNTER — Encounter: Payer: Self-pay | Admitting: Family Medicine

## 2024-04-19 DIAGNOSIS — M25542 Pain in joints of left hand: Secondary | ICD-10-CM

## 2024-05-12 NOTE — Progress Notes (Unsigned)
 "  Angie Wilcox - 43 y.o. female MRN [id number]  Date of birth: 13-Oct-1980  Office Visit Note: Visit Date: 05/13/2024 PCP: No primary care provider on file. Referred by: Thedora Garnette HERO, MD  Subjective: No chief complaint on file.  HPI: Angie Wilcox is a pleasant 43 y.o. female who presents today for specific hand surgical evaluation regarding a left long finger PIP sprain.  This occurred back in August after an overuse injury.  She was seen by her PCP who recommended a finger splint at that time.  She did utilize the finger splint temporarily however she felt that it was making her symptoms worse.  Since that time, her range of motion has improved, she does have some ongoing pain and sensitivity at the PIP level.  Pertinent ROS were reviewed with the patient and found to be negative unless otherwise specified above in HPI.   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 Left hand pain   2. Left 3rd finger PIP joint pain     Plan: Extensive discussion was had with the patient today regarding her left long finger PIP sprain.  She does not have any gross instability on her examination today, x-rays are unremarkable.  This is very consistent with a PIP sprain.  I did explain to her the likely timeline for these injuries to appropriately heal, which can often take up to 1 year.  She is encouraged to continue with range of motion and use of the left hand as tolerated moving forward.  She can utilize oral and topical nonsteroidal anti-inflammatories as needed.  She is welcome return to me in the future should symptoms fail to improve or resolve.  She expressed full understanding.  Follow-up: No follow-ups on  file.   Meds & Orders: No orders of the defined types were placed in this encounter.   Orders Placed This Encounter  Procedures   XR Finger Middle Left     Procedures: No procedures performed      Clinical History: No specialty comments available.  She reports that she has never smoked. She has never used smokeless tobacco. No results for input(s): HGBA1C, LABURIC in the last 8760 hours.  Objective:   Vital Signs: There were no vitals taken for this visit.  Physical Exam  Gen: Well-appearing, in no acute distress; non-toxic CV: Regular Rate. Well-perfused. Warm.  Resp: Breathing unlabored on room air; no wheezing. Psych: Fluid speech in conversation; appropriate affect; normal thought process  Ortho Exam Left hand: Long finger - Notable tenderness at the PIP level, minimal swelling, no joint instability with stress testing - Composite fist without restriction - Sensation intact in all distributions median/radial/ulnar, AIN/PIN/interosseous intact - Hand remains warm well-perfused   Imaging: XR Finger Middle Left Result Date: 05/13/2024 There is no evidence of fracture or dislocation. There is no evidence of arthropathy or other focal bone abnormality. Soft tissues are unremarkable.    Past Medical/Family/Surgical/Social History: Medications & Allergies reviewed per EMR, new medications updated. 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   Mild intermittent asthma 12/31/2019   History of gestational diabetes 10/23/2017   Past Medical History:  Diagnosis  Date   Allergy    Herpes    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 [facility], Hand Surgery  "

## 2024-05-13 ENCOUNTER — Other Ambulatory Visit (INDEPENDENT_AMBULATORY_CARE_PROVIDER_SITE_OTHER): Payer: Self-pay

## 2024-05-13 ENCOUNTER — Ambulatory Visit: Admitting: Orthopedic Surgery

## 2024-05-13 DIAGNOSIS — M79642 Pain in left hand: Secondary | ICD-10-CM

## 2024-05-13 DIAGNOSIS — M25542 Pain in joints of left hand: Secondary | ICD-10-CM | POA: Diagnosis not present

## 2024-06-07 ENCOUNTER — Other Ambulatory Visit: Payer: Self-pay

## 2024-06-07 DIAGNOSIS — D241 Benign neoplasm of right breast: Secondary | ICD-10-CM
